# Patient Record
Sex: Male | Born: 1960 | Race: Black or African American | Hispanic: No | Marital: Married | Smoking: Former smoker
Health system: Southern US, Community
[De-identification: ages and names within clinical notes are randomized; demographics above are authoritative.]

## PROBLEM LIST (undated history)

## (undated) DIAGNOSIS — I10 Essential (primary) hypertension: Secondary | ICD-10-CM

## (undated) DIAGNOSIS — W3400XA Accidental discharge from unspecified firearms or gun, initial encounter: Secondary | ICD-10-CM

## (undated) DIAGNOSIS — K219 Gastro-esophageal reflux disease without esophagitis: Secondary | ICD-10-CM

## (undated) DIAGNOSIS — Y249XXA Unspecified firearm discharge, undetermined intent, initial encounter: Secondary | ICD-10-CM

## (undated) HISTORY — PX: HERNIA REPAIR: SHX51

## (undated) HISTORY — PX: APPENDECTOMY: SHX54

## (undated) HISTORY — PX: COLON SURGERY: SHX602

## (undated) HISTORY — PX: MANDIBLE FRACTURE SURGERY: SHX706

## (undated) HISTORY — PX: TOE AMPUTATION: SHX809

## (undated) NOTE — Progress Notes (Signed)
 Formatting of this note is different from the original. Images from the original note were not included.  [address] [place] [address] [phone]  PCP: SOLMON CHERISSE NED, PA-C Elliot Yerian 62 y.o.([birth year]) [facility] MRN: [id number]  Office Visit: 12/10/23 Subjective   Anthony Flowers is a 22 y.o. male who presents for establishment of cardiac care  Chief Complaint:  Chief Complaint  Patient presents with   Consult    Patient was referred by pcp, having CHF, SOB, some chest pain on and off, irregular heart beat,he was seen at [facility] in [facility] March 28.   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   Case discussed with the both patient and patient's family with the patient implied consent Since he refused emergent services I did tell him to proceed with the ER care at this time, patient told me we will go to the ER himself and does not want EMS called.  I told him to make sure to follow up otherwise he has a risk of having bradyarrhythmias worsened, also has risk of pauses and other further major adverse cardiovascular events and he voiced understanding of my recommendation  For his shortness of breath and chest pain after his ER visit and if everything is ruled  out he will benefit from cardiac risk stratification at some point in the future  We will get a Lexiscan MPS scan and echocardiogram in the future after bradyarrhythmias worked up at higher level of care Discussed this plan in detail with the patient and patient's family with the patient implied consent inpatient presence, questions encouraged and answered voiced understanding Review of Systems Review of system done and is negative except in HPI       No results found.              STEADI Stay Independent Questionnaire:  In the past year, patient experienced:    . Current Outpatient Medications on File Prior to Visit  Medication Sig   amLODIPine  (NORVASC ) 10 MG tablet Take 1 tablet (10 mg total) by mouth daily   atorvastatin  (LIPITOR) 40 MG tablet Take 1 tablet (40 mg total) by mouth daily  Indications: High Amount of Fats in the Blood   dapagliflozin (Farxiga) 10 MG Take 1 tablet (10 mg total) by mouth Every morning   furosemide (LASIX) 20 MG tablet Take 1 tablet (20 mg total) by mouth daily as needed   insulin  aspart (NovoLOG ) 100 UNIT/ML injection Inject under the skin 3 times a day (before meals)   omeprazole (PriLOSEC) 40 MG capsule Take 1 capsule (40 mg total) by mouth daily   sacubitril-valsartan (Entresto) 49-51 MG Take 1 tablet by mouth 2 (two) times a day  Indications: Cardiac Failure   sildenafil  (VIAGRA ) 100 MG tablet Take 1 tablet (100 mg total)  by mouth daily as needed for erectile dysfunction   [DISCONTINUED] aspirin  81 MG chewable tablet Chew 1 tablet (81 mg total) daily   [DISCONTINUED] carvedilol  (COREG ) 6.25 MG tablet Take 1 tablet (6.25 mg total) by mouth 2 (two) times a day with meals   [DISCONTINUED] cetirizine (ZyrTEC) 10 MG tablet Take 1 tablet (10 mg total) by mouth daily   [DISCONTINUED] doxazosin (CARDURA) 1 MG tablet Take 1 tablet (1 mg total) by mouth nightly   [DISCONTINUED] ferrous sulfate 325 (65 FE) MG EC tablet Take 1 tablet (325 mg total) by mouth 3 (three)  times a day with meals   [DISCONTINUED] Insulin  Aspart Prot & Aspart (NovoLOG  Mix 70/30 FlexPen) (70-30) 100 UNIT/ML SUPN Inject under the skin   [DISCONTINUED] insulin  glargine (LANTUS ) 100 UNIT/ML injection Inject under the skin nightly   No current facility-administered medications on file prior to visit.   Current Outpatient Medications  Medication Sig Dispense Refill   amLODIPine  (NORVASC ) 10 MG tablet Take 1 tablet (10 mg total) by mouth daily     atorvastatin  (LIPITOR) 40 MG tablet Take 1 tablet (40 mg total) by mouth daily  Indications: High Amount of Fats in the Blood     dapagliflozin (Farxiga) 10 MG Take 1 tablet (10 mg total) by mouth Every morning     furosemide (LASIX) 20 MG tablet Take 1 tablet (20 mg total) by mouth daily as needed     insulin  aspart (NovoLOG ) 100 UNIT/ML injection Inject under the skin 3 times a day (before meals)     omeprazole (PriLOSEC) 40 MG capsule Take 1 capsule (40 mg total) by mouth daily     sacubitril-valsartan (Entresto) 49-51 MG Take 1 tablet by mouth 2 (two) times a day  Indications: Cardiac Failure     sildenafil  (VIAGRA ) 100 MG tablet Take 1 tablet (100 mg total) by mouth daily as needed for erectile dysfunction     No current facility-administered medications for this visit.  Last reviewed on 12/10/2023 10:17 AM by Yesenia Mercado, MA   Medications Discontinued During This Encounter  Medication Reason   insulin  glargine (LANTUS ) 100 UNIT/ML injection Discontinued by another clinician   doxazosin (CARDURA) 1 MG tablet Discontinued by another clinician   ferrous sulfate 325 (65 FE) MG EC tablet Discontinued by another clinician   carvedilol  (COREG ) 6.25 MG tablet Discontinued by another clinician   cetirizine (ZyrTEC) 10 MG tablet Discontinued by another clinician   aspirin  81 MG chewable tablet Discontinued by another clinician   Insulin  Aspart Prot & Aspart (NovoLOG  Mix 70/30 FlexPen) (70-30) 100 UNIT/ML SUPN Discontinued by another  clinician   Medications reviewed by MERCADO, YESENIA at 12/10/2023 10:17 AM  Allergies  Allergen Reactions   Chlorhexidine  Itching and Rash    Develops skin irritation   Tape/Adhesives Hives and Rash    Per pt: can only have paper tape applied to skin   Latex    Penicillins    Fish Allergy Hives and Rash    Patient unsure which fish   Shellfish-Derived Products Hives and Rash    Patient unsure which fish   Allergies reviewed by AURELIA MAIERS  at 12/10/2023 10:14 AM  Past Medical History:  Diagnosis Date   Diabetes mellitus (CMS/HCC)    Heart failure, unspecified 12/07/2023   Hyperlipidemia 12/07/2023   Hypertension    Primary hypertension 12/07/2023   Past Surgical History:  Procedure Laterality Date   AMPUTATION     APPENDECTOMY     FOOT AMPUTATION Left  HERNIA REPAIR     LUNG SURGERY     Social History   Socioeconomic History   Marital status: Married    Spouse name: Not on file   Number of children: Not on file   Years of education: Not on file   Highest education level: Not on file  Tobacco Use   Smoking status: Former   Smokeless tobacco: Never  Vaping Use   Vaping status: never used  Substance and Sexual Activity   Alcohol use: Not Currently   Drug use: Not Currently   Sexual activity: Not on file   No family history on file.   Objective  Vitals:   12/10/23 1002  BP: (!) 167/88  BP Location: Left arm  Patient Position: Sitting  Pulse: 75  SpO2: 99%  Weight: 112 kg (248 lb)  Height: 5' 10 (1.778 m)   Physical Exam   Const: Mild distress  Head:  Atraumatic Mental: Oriented to questions  ENT:  Normal appearing external Ears, Nose and Mouth  Neck:  Neck supple  Resp:  No obvious tachypnea  Cards: Normal rate with abnormal EKG as above  Abd:  No obvious distention.  Skin:  No obvious skin lesion on face  Back:  No severe kyphosis or scoliosis  Ext:  No significant deformity was noted Psych.            No acute anxiety noted Neuro.             Alert to questions  Results / Recent Labs:     Assessment/Plan   1. 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   Case discussed with the both patient and patient's family with the patient implied consent Since he refused emergent services I did tell him to proceed with the ER care at this time, patient told me we will go to the ER himself and does not want EMS called.  I told him to make sure to follow up otherwise he has a risk of having bradyarrhythmias worsened, also has risk of pauses and other further major adverse cardiovascular events and he voiced understanding of my recommendation  For his shortness of breath and chest pain after his ER visit and if everything is ruled out he will benefit from cardiac risk stratification at some point in the future  We will get a Lexiscan MPS scan and echocardiogram in the future after bradyarrhythmias worked up at higher level of care Discussed this plan in detail with the patient and patient's family with the patient implied consent inpatient presence, questions encouraged and answered voiced understanding  2. Severe stage 4- 5 CKD Patient's creatinine around 3.5 and higher Patient's GFR is below 20 with severe stage 4-5 kidney disease  Patient is kept on Entresto by the primary care at this time and also followed up by Nephrology  Further  care per Nephrology at this time [place] and  his medications and any other further recommendations  3. Hypertension  On medical management  Further titration and follow up after ruled out for any acute bradyarrhythmias at higher level of care today which I recommended to the patient  4. Diabetes mellitus  Per primary team   5.  Dyslipidemia Patient on statin therapy   In the meantime, continue with PCP for further aggressive risk factor modification measures, including:  Exercise, weight loss and reduced sodium intake to help with blood pressure control, with a goal of <130/80.  Avoiding exposure to tobacco products in any form  and compliance with medications. Achieving and/or maintaining ideal body weight and 2 gram sodium diet. Maintaining  Hgb/A1c less than 6.5,  total cholesterol less than 200 mg/dL, HDL greater than 50 mg/dL, LDL less than 70 mg/dL, triglycerides less than 150 mg/dL.  Lipid and liver profile checked per pt's PCP's preferred schedule and  reduce intake of cholesterol and saturated fats such as fried foods,  red meat, dairy products, and high caloric foods.   I have provided services to the patient in the office setting.  During the course of the visit, we reviewed the patient's chief complaint, medical history, current symptoms, medications, diagnosis, test results, treatment plan.  A physical exam was performed.  All questions answered.  Chart review and documentation completed, future tests and/or procedures ordered. The visit required a hgh  degree of complexity and decision making due to multiple cardiac disease processes and comorbidities.    Electronically Signed by: Marcelino Lieu, MD Electronically signed by Marcelino Lieu, MD at 12/10/2023  9:18 AM PDT

---

## 1998-12-15 ENCOUNTER — Emergency Department (HOSPITAL_COMMUNITY): Admission: EM | Admit: 1998-12-15 | Discharge: 1998-12-15 | Payer: Self-pay | Admitting: Emergency Medicine

## 2000-09-26 ENCOUNTER — Encounter: Payer: Self-pay | Admitting: *Deleted

## 2000-09-26 ENCOUNTER — Inpatient Hospital Stay (HOSPITAL_COMMUNITY): Admission: EM | Admit: 2000-09-26 | Discharge: 2000-09-28 | Payer: Self-pay | Admitting: *Deleted

## 2000-09-26 ENCOUNTER — Encounter: Payer: Self-pay | Admitting: Internal Medicine

## 2000-12-01 ENCOUNTER — Emergency Department (HOSPITAL_COMMUNITY): Admission: EM | Admit: 2000-12-01 | Discharge: 2000-12-02 | Payer: Self-pay | Admitting: Emergency Medicine

## 2004-06-30 ENCOUNTER — Ambulatory Visit: Payer: Self-pay | Admitting: *Deleted

## 2004-07-03 ENCOUNTER — Emergency Department (HOSPITAL_COMMUNITY): Admission: EM | Admit: 2004-07-03 | Discharge: 2004-07-03 | Payer: Self-pay | Admitting: Emergency Medicine

## 2004-07-12 ENCOUNTER — Ambulatory Visit: Payer: Self-pay | Admitting: Family Medicine

## 2004-07-18 ENCOUNTER — Ambulatory Visit: Payer: Self-pay | Admitting: Internal Medicine

## 2004-08-08 ENCOUNTER — Ambulatory Visit: Payer: Self-pay | Admitting: Internal Medicine

## 2004-09-06 ENCOUNTER — Ambulatory Visit: Payer: Self-pay | Admitting: Internal Medicine

## 2004-11-08 ENCOUNTER — Ambulatory Visit: Payer: Self-pay | Admitting: Internal Medicine

## 2009-09-06 ENCOUNTER — Encounter: Admission: RE | Admit: 2009-09-06 | Discharge: 2009-09-06 | Payer: Self-pay | Admitting: Podiatrist

## 2009-11-23 ENCOUNTER — Encounter: Admission: RE | Admit: 2009-11-23 | Discharge: 2009-11-23 | Payer: Self-pay | Admitting: Surgery

## 2010-03-06 ENCOUNTER — Inpatient Hospital Stay (HOSPITAL_COMMUNITY)
Admission: EM | Admit: 2010-03-06 | Discharge: 2010-03-09 | Payer: Self-pay | Source: Home / Self Care | Admitting: Emergency Medicine

## 2010-03-07 ENCOUNTER — Ambulatory Visit: Payer: Self-pay | Admitting: Vascular Surgery

## 2010-03-07 ENCOUNTER — Encounter (INDEPENDENT_AMBULATORY_CARE_PROVIDER_SITE_OTHER): Payer: Self-pay | Admitting: Internal Medicine

## 2010-08-30 ENCOUNTER — Emergency Department (HOSPITAL_COMMUNITY)
Admission: EM | Admit: 2010-08-30 | Discharge: 2010-08-30 | Payer: Self-pay | Source: Home / Self Care | Admitting: Emergency Medicine

## 2010-09-05 LAB — POCT I-STAT 3, ART BLOOD GAS (G3+)
Acid-Base Excess: 2 mmol/L (ref 0.0–2.0)
Bicarbonate: 25.6 meq/L — ABNORMAL HIGH (ref 20.0–24.0)
O2 Saturation: 100 %
Patient temperature: 98.7
TCO2: 27 mmol/L (ref 0–100)
pCO2 arterial: 35.4 mmHg (ref 35.0–45.0)
pH, Arterial: 7.468 — ABNORMAL HIGH (ref 7.350–7.450)
pO2, Arterial: 268 mmHg — ABNORMAL HIGH (ref 80.0–100.0)

## 2010-09-05 LAB — CBC
HCT: 40.9 % (ref 39.0–52.0)
Hemoglobin: 12.9 g/dL — ABNORMAL LOW (ref 13.0–17.0)
MCH: 22.4 pg — ABNORMAL LOW (ref 26.0–34.0)
MCHC: 31.5 g/dL (ref 30.0–36.0)
MCV: 71.1 fL — ABNORMAL LOW (ref 78.0–100.0)
Platelets: 376 K/uL (ref 150–400)
RBC: 5.75 MIL/uL (ref 4.22–5.81)
RDW: 17 % — ABNORMAL HIGH (ref 11.5–15.5)
WBC: 6.3 K/uL (ref 4.0–10.5)

## 2010-09-05 LAB — BASIC METABOLIC PANEL WITH GFR
BUN: 8 mg/dL (ref 6–23)
CO2: 27 meq/L (ref 19–32)
Calcium: 9.6 mg/dL (ref 8.4–10.5)
Chloride: 106 meq/L (ref 96–112)
Creatinine, Ser: 1.48 mg/dL (ref 0.4–1.5)
GFR calc Af Amer: >60 mL/min (ref >60)
GFR calc non Af Amer: 50 mL/min — ABNORMAL LOW (ref >60)
Glucose, Bld: 134 mg/dL — ABNORMAL HIGH (ref 70–99)
Potassium: 3.9 meq/L (ref 3.5–5.1)
Sodium: 140 meq/L (ref 135–145)

## 2010-09-05 LAB — CARBOXYHEMOGLOBIN
Carboxyhemoglobin: 3.1 % — ABNORMAL HIGH (ref 0.5–1.5)
Methemoglobin: 0.5 % (ref 0.0–1.5)
O2 Saturation: 100 %
Total hemoglobin: 12.1 g/dL — ABNORMAL LOW (ref 13.5–18.0)

## 2010-09-05 LAB — DIFFERENTIAL
Basophils Absolute: 0 K/uL (ref 0.0–0.1)
Basophils Relative: 0 % (ref 0–1)
Eosinophils Absolute: 0.2 K/uL (ref 0.0–0.7)
Eosinophils Relative: 3 % (ref 0–5)
Lymphocytes Relative: 34 % (ref 12–46)
Lymphs Abs: 2.1 K/uL (ref 0.7–4.0)
Monocytes Absolute: 0.4 K/uL (ref 0.1–1.0)
Monocytes Relative: 7 % (ref 3–12)
Neutro Abs: 3.6 K/uL (ref 1.7–7.7)
Neutrophils Relative %: 56 % (ref 43–77)

## 2010-09-19 ENCOUNTER — Emergency Department (HOSPITAL_COMMUNITY)
Admission: EM | Admit: 2010-09-19 | Discharge: 2010-09-19 | Payer: Self-pay | Source: Home / Self Care | Admitting: Family Medicine

## 2010-11-05 LAB — GLUCOSE, CAPILLARY
Glucose-Capillary: 106 mg/dL — ABNORMAL HIGH (ref 70–99)
Glucose-Capillary: 118 mg/dL — ABNORMAL HIGH (ref 70–99)
Glucose-Capillary: 168 mg/dL — ABNORMAL HIGH (ref 70–99)
Glucose-Capillary: 178 mg/dL — ABNORMAL HIGH (ref 70–99)
Glucose-Capillary: 221 mg/dL — ABNORMAL HIGH (ref 70–99)
Glucose-Capillary: 245 mg/dL — ABNORMAL HIGH (ref 70–99)
Glucose-Capillary: 268 mg/dL — ABNORMAL HIGH (ref 70–99)
Glucose-Capillary: 285 mg/dL — ABNORMAL HIGH (ref 70–99)
Glucose-Capillary: 307 mg/dL — ABNORMAL HIGH (ref 70–99)
Glucose-Capillary: 323 mg/dL — ABNORMAL HIGH (ref 70–99)
Glucose-Capillary: 348 mg/dL — ABNORMAL HIGH (ref 70–99)
Glucose-Capillary: 351 mg/dL — ABNORMAL HIGH (ref 70–99)
Glucose-Capillary: 385 mg/dL — ABNORMAL HIGH (ref 70–99)
Glucose-Capillary: 428 mg/dL — ABNORMAL HIGH (ref 70–99)

## 2010-11-05 LAB — CBC
HCT: 38.2 % — ABNORMAL LOW (ref 39.0–52.0)
HCT: 39.6 % (ref 39.0–52.0)
HCT: 43.2 % (ref 39.0–52.0)
Hemoglobin: 11.9 g/dL — ABNORMAL LOW (ref 13.0–17.0)
Hemoglobin: 12.7 g/dL — ABNORMAL LOW (ref 13.0–17.0)
Hemoglobin: 13.7 g/dL (ref 13.0–17.0)
MCH: 23.2 pg — ABNORMAL LOW (ref 26.0–34.0)
MCH: 23.5 pg — ABNORMAL LOW (ref 26.0–34.0)
MCH: 23.9 pg — ABNORMAL LOW (ref 26.0–34.0)
MCHC: 31.2 g/dL (ref 30.0–36.0)
MCHC: 31.8 g/dL (ref 30.0–36.0)
MCHC: 32 g/dL (ref 30.0–36.0)
MCV: 74 fL — ABNORMAL LOW (ref 78.0–100.0)
MCV: 74.2 fL — ABNORMAL LOW (ref 78.0–100.0)
MCV: 74.8 fL — ABNORMAL LOW (ref 78.0–100.0)
Platelets: 246 K/uL (ref 150–400)
Platelets: 258 K/uL (ref 150–400)
Platelets: 283 K/uL (ref 150–400)
RBC: 5.14 MIL/uL (ref 4.22–5.81)
RBC: 5.3 MIL/uL (ref 4.22–5.81)
RBC: 5.84 MIL/uL — ABNORMAL HIGH (ref 4.22–5.81)
RDW: 15.8 % — ABNORMAL HIGH (ref 11.5–15.5)
RDW: 15.9 % — ABNORMAL HIGH (ref 11.5–15.5)
RDW: 16 % — ABNORMAL HIGH (ref 11.5–15.5)
WBC: 11.3 K/uL — ABNORMAL HIGH (ref 4.0–10.5)
WBC: 8.3 K/uL (ref 4.0–10.5)
WBC: 9.2 K/uL (ref 4.0–10.5)

## 2010-11-05 LAB — D-DIMER, QUANTITATIVE: D-Dimer, Quant: 1.2 ug{FEU}/mL — ABNORMAL HIGH (ref 0.00–0.48)

## 2010-11-05 LAB — BASIC METABOLIC PANEL WITH GFR
BUN: 7 mg/dL (ref 6–23)
CO2: 28 meq/L (ref 19–32)
Calcium: 9 mg/dL (ref 8.4–10.5)
Chloride: 96 meq/L (ref 96–112)
Creatinine, Ser: 1.33 mg/dL (ref 0.4–1.5)
GFR calc Af Amer: >60 mL/min (ref >60)
GFR calc non Af Amer: 57 mL/min — ABNORMAL LOW (ref >60)
Glucose, Bld: 349 mg/dL — ABNORMAL HIGH (ref 70–99)
Potassium: 3.7 meq/L (ref 3.5–5.1)
Sodium: 135 meq/L (ref 135–145)

## 2010-11-05 LAB — CARDIAC PANEL(CRET KIN+CKTOT+MB+TROPI)
CK, MB: 1.2 ng/mL (ref 0.3–4.0)
CK, MB: 1.3 ng/mL (ref 0.3–4.0)
CK, MB: 1.6 ng/mL (ref 0.3–4.0)
Relative Index: 1.5 (ref 0.0–2.5)
Relative Index: INVALID (ref 0.0–2.5)
Relative Index: INVALID (ref 0.0–2.5)
Total CK: 108 U/L (ref 7–232)
Total CK: 92 U/L (ref 7–232)
Total CK: 98 U/L (ref 7–232)
Troponin I: 0.02 ng/mL (ref 0.00–0.06)
Troponin I: 0.02 ng/mL (ref 0.00–0.06)
Troponin I: 0.03 ng/mL (ref 0.00–0.06)

## 2010-11-05 LAB — URINALYSIS, ROUTINE W REFLEX MICROSCOPIC
Glucose, UA: NEGATIVE mg/dL
Ketones, ur: 15 mg/dL — AB
Leukocytes, UA: NEGATIVE
Nitrite: NEGATIVE
Protein, ur: >300 mg/dL — AB
Specific Gravity, Urine: 1.022 (ref 1.005–1.030)
Urobilinogen, UA: >8 mg/dL — ABNORMAL HIGH (ref 0.0–1.0)
pH: 5.5 (ref 5.0–8.0)

## 2010-11-05 LAB — COMPREHENSIVE METABOLIC PANEL WITH GFR
ALT: 17 U/L (ref 0–53)
ALT: 18 U/L (ref 0–53)
AST: 29 U/L (ref 0–37)
AST: 36 U/L (ref 0–37)
Albumin: 2.5 g/dL — ABNORMAL LOW (ref 3.5–5.2)
Albumin: 2.9 g/dL — ABNORMAL LOW (ref 3.5–5.2)
Alkaline Phosphatase: 61 U/L (ref 39–117)
Alkaline Phosphatase: 67 U/L (ref 39–117)
BUN: 10 mg/dL (ref 6–23)
BUN: 14 mg/dL (ref 6–23)
CO2: 23 meq/L (ref 19–32)
CO2: 23 meq/L (ref 19–32)
Calcium: 7.9 mg/dL — ABNORMAL LOW (ref 8.4–10.5)
Calcium: 8.6 mg/dL (ref 8.4–10.5)
Chloride: 101 meq/L (ref 96–112)
Chloride: 98 meq/L (ref 96–112)
Creatinine, Ser: 1.58 mg/dL — ABNORMAL HIGH (ref 0.4–1.5)
Creatinine, Ser: 1.65 mg/dL — ABNORMAL HIGH (ref 0.4–1.5)
GFR calc Af Amer: 54 mL/min — ABNORMAL LOW (ref >60)
GFR calc Af Amer: 57 mL/min — ABNORMAL LOW (ref >60)
GFR calc non Af Amer: 45 mL/min — ABNORMAL LOW (ref >60)
GFR calc non Af Amer: 47 mL/min — ABNORMAL LOW (ref >60)
Glucose, Bld: 128 mg/dL — ABNORMAL HIGH (ref 70–99)
Glucose, Bld: 330 mg/dL — ABNORMAL HIGH (ref 70–99)
Potassium: 3.5 meq/L (ref 3.5–5.1)
Potassium: 4.1 meq/L (ref 3.5–5.1)
Sodium: 130 meq/L — ABNORMAL LOW (ref 135–145)
Sodium: 133 meq/L — ABNORMAL LOW (ref 135–145)
Total Bilirubin: 1.1 mg/dL (ref 0.3–1.2)
Total Bilirubin: 1.3 mg/dL — ABNORMAL HIGH (ref 0.3–1.2)
Total Protein: 5.9 g/dL — ABNORMAL LOW (ref 6.0–8.3)
Total Protein: 6.6 g/dL (ref 6.0–8.3)

## 2010-11-05 LAB — CULTURE, BLOOD (ROUTINE X 2)
Culture: NO GROWTH
Culture: NO GROWTH

## 2010-11-05 LAB — DIFFERENTIAL
Basophils Absolute: 0 K/uL (ref 0.0–0.1)
Basophils Relative: 0 % (ref 0–1)
Eosinophils Absolute: 0 K/uL (ref 0.0–0.7)
Eosinophils Relative: 0 % (ref 0–5)
Lymphocytes Relative: 12 % (ref 12–46)
Lymphs Abs: 1.3 K/uL (ref 0.7–4.0)
Monocytes Absolute: 1 K/uL (ref 0.1–1.0)
Monocytes Relative: 9 % (ref 3–12)
Neutro Abs: 8.9 K/uL — ABNORMAL HIGH (ref 1.7–7.7)
Neutrophils Relative %: 79 % — ABNORMAL HIGH (ref 43–77)

## 2010-11-05 LAB — URINE CULTURE
Colony Count: NO GROWTH
Culture: NO GROWTH

## 2010-11-05 LAB — PROTEIN / CREATININE RATIO, URINE
Creatinine, Urine: 199.3 mg/dL
Protein Creatinine Ratio: 0.64 — ABNORMAL HIGH (ref 0.00–0.15)
Total Protein, Urine: 128 mg/dL

## 2010-11-05 LAB — URINE MICROSCOPIC-ADD ON

## 2010-11-05 LAB — DRUGS OF ABUSE SCREEN W/O ALC, ROUTINE URINE
Amphetamine Screen, Ur: NEGATIVE
Barbiturate Quant, Ur: NEGATIVE
Benzodiazepines.: NEGATIVE
Cocaine Metabolites: POSITIVE — AB
Creatinine,U: 202.7 mg/dL
Marijuana Metabolite: NEGATIVE
Methadone: NEGATIVE
Opiate Screen, Urine: NEGATIVE
Phencyclidine (PCP): NEGATIVE
Propoxyphene: NEGATIVE

## 2010-11-05 LAB — COCAINE, URINE, CONFIRMATION: Benzoylecgonine GC/MS Conf: 1153 ng/mL — ABNORMAL HIGH

## 2010-11-05 LAB — HEMOGLOBIN A1C
Hgb A1c MFr Bld: 7.2 % — ABNORMAL HIGH (ref <5.7)
Mean Plasma Glucose: 160 mg/dL — ABNORMAL HIGH (ref <117)

## 2010-11-05 LAB — MRSA PCR SCREENING: MRSA by PCR: NEGATIVE

## 2010-11-05 LAB — TSH: TSH: 1.514 u[IU]/mL (ref 0.350–4.500)

## 2010-11-05 LAB — GLUCOSE, RANDOM: Glucose, Bld: 345 mg/dL — ABNORMAL HIGH (ref 70–99)

## 2010-11-10 ENCOUNTER — Inpatient Hospital Stay (INDEPENDENT_AMBULATORY_CARE_PROVIDER_SITE_OTHER)
Admission: RE | Admit: 2010-11-10 | Discharge: 2010-11-10 | Disposition: A | Discharge status: Home / Self Care | Payer: Medicare Other | Source: Ambulatory Visit | Attending: Family Medicine | Admitting: Family Medicine

## 2010-11-10 DIAGNOSIS — I10 Essential (primary) hypertension: Secondary | ICD-10-CM

## 2010-11-10 DIAGNOSIS — E119 Type 2 diabetes mellitus without complications: Secondary | ICD-10-CM

## 2010-11-10 LAB — GLUCOSE, CAPILLARY: Glucose-Capillary: 369 mg/dL — ABNORMAL HIGH (ref 70–99)

## 2011-01-06 NOTE — Discharge Summary (Signed)
[facility]  Patient:    Anthony Flowers, Anthony Flowers                      MRN: [id number] Adm. Date:  [id number] Disc. Date: [id number] Attending:  Thomes Lolling Dictator:   Jannette Fogo, M.D. CC:         [facility]   Discharge Summary  DISCHARGE DIAGNOSES: 1. Diabetic ketoacidosis, resolved. 2. Diabetes mellitus. 3. Severe periodontal disease. 4. Cocaine abuse. 5. Tobacco abuse. 6. Gastroesophageal reflux disease. 7. Microcytic anemia with heme-negative stools.  DISCHARGE MEDICATIONS: 1. Insulin 70/30, 35 units q.a.m. and 30 units q.p.m. 2. Protonix 1 p.o. q.h.s. 3. Maalox 1 tablespoon p.r.n.  FOLLOWUP:  The patient has an appointment at the [facility] on February 14th at 2 p.m.  His severe periodontal disease seems to play a role in his developing DKA and needs to be followed up.  He also has a follow-up appointment at [facility] on November 09, 2000.  PROCEDURES:  None.  CONSULTATIONS:  None.  HISTORY OF PRESENT ILLNESS:   The patient is a 50 year old African-American male diagnosed with diabetes in 1999 who presented with a one-day history of nausea and vomiting.  He is homeless and states that he ran out of food approximately 2-3 days prior to admission and thus stopped taking his insulin. He began having nausea and diffuse abdominal pain the day prior to admission and had approximately 12 episodes of emesis.  His last episode of emesis was on the morning of admission.  He also complained of substernal-epigastric pain described as burning and radiating to his left arm, but with no accompanying shortness of breath.  The pain was constant.  PHYSICAL EXAMINATION:  Temperature 96.5, blood pressure 148/88, pulse 108, respirations 20.  General:  Thin, alert and oriented African-American male. awake and oriented but drowsy.  HEENT:  PERRL, EOMI.  Poor dentition, with several visibly loose teeth.  Neck:  No LAD, no JVD.   Cardiovascular:  Regular rate and rhythm, no murmurs, rubs or gallops.  Respiratory:  Clear with good air movement.  Abdomen:  Soft, nontender with positive bowel sounds, no hepatosplenomegaly.  Extremities:  No clubbing, cyanosis, or edema.  Rectal: Heme-negative.  Neurologic:  Cranial nerves II-XII intact, no focal deficits.  ADMISSION LABORATORY DATA:  Sodium 131, potassium 5.0, chloride 86, bicarb 20, BUN 26, creatinine 2.0, platelets 526, total protein 8.9, albumin 4.3, total bilirubin 2.0, alk phos 99, AST 15, ALT 12, white blood cells 14.4, hemoglobin 13, platelets 590, MCV 71.9, PT 13.3, INR 1.1.  Serum acetone positive, amylase 34, lipase 17, urine drug screen positive for cocaine and PCAs.  CK-MB 119, troponin 0.01.  Urinalysis:  Specific gravity 1.023, glucose greater than 1000, ketones greater than 80.  EKG shows sinus tachycardia with first degree AV block.  Chest x-ray showed no active disease.  HOSPITAL COURSE: #1 - Diabetic ketoacidosis.  The patient was admitted and started on an insulin drip.  His acidosis was mild on admission and quickly corrected.  The insulin drip was able to be discontinued several hours after admission.  He was restarted on his regular dose of 70/30 insulin and was stable at discharge.  #2 - Periodontal disease.  The patient has not had dental care in several years.  He states that due to his poor dentition, he frequently stops eating, thus quits taking his insulin and develops DKA.  It is therefore, quite urgent that he receive  some dental care.  The [facility] was kind enough to set up an appointment for the patient on an emergent basis for February 14th at 2 p.m.  #3 - Microcytic anemia.  The patient was not anemic on admission, however, after rehydration, his hemoglobin dropped to 10.9, his MCV is extremely low at 73.9, possibly consistent with thalassemia.  His stools were heme-negative. His anemia can be further worked up  as an outpatient by his new physicians at [facility].  DISCHARGE LABORATORY DATA:  White blood cells 6.6, hemoglobin 9.7, MCV 74.1, platelets 370, PT 13.3, INR 1.1, sodium 131, potassium 3.1, chloride 101, bicarb 25, glucose 196, BUN 5, creatinine 0.6, calcium 8.4, hemoglobin A1c 11.3, last set of cardiac enzymes, CK 98, MB 2.2, troponin 0.01, cholesterol 122, triglycerides 57, HDL 40, LDL 71.  DD:  10/19/00 TD:  10/21/00 Job: [id number]

## 2011-09-01 ENCOUNTER — Encounter (HOSPITAL_BASED_OUTPATIENT_CLINIC_OR_DEPARTMENT_OTHER): Payer: Medicare Other

## 2011-09-29 ENCOUNTER — Encounter (HOSPITAL_BASED_OUTPATIENT_CLINIC_OR_DEPARTMENT_OTHER): Payer: Medicare Other

## 2011-09-29 ENCOUNTER — Encounter (HOSPITAL_BASED_OUTPATIENT_CLINIC_OR_DEPARTMENT_OTHER): Payer: Medicare Other | Attending: General Surgery

## 2011-09-29 DIAGNOSIS — L97509 Non-pressure chronic ulcer of other part of unspecified foot with unspecified severity: Secondary | ICD-10-CM | POA: Insufficient documentation

## 2011-09-29 DIAGNOSIS — S98139A Complete traumatic amputation of one unspecified lesser toe, initial encounter: Secondary | ICD-10-CM | POA: Insufficient documentation

## 2011-09-29 DIAGNOSIS — E1169 Type 2 diabetes mellitus with other specified complication: Secondary | ICD-10-CM | POA: Insufficient documentation

## 2011-09-29 DIAGNOSIS — I10 Essential (primary) hypertension: Secondary | ICD-10-CM | POA: Insufficient documentation

## 2011-09-29 DIAGNOSIS — I890 Lymphedema, not elsewhere classified: Secondary | ICD-10-CM | POA: Insufficient documentation
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
## 2011-10-05 ENCOUNTER — Other Ambulatory Visit (HOSPITAL_BASED_OUTPATIENT_CLINIC_OR_DEPARTMENT_OTHER): Payer: Self-pay | Admitting: General Surgery

## 2011-10-05 ENCOUNTER — Ambulatory Visit (HOSPITAL_COMMUNITY)
Admission: RE | Admit: 2011-10-05 | Discharge: 2011-10-05 | Disposition: A | Discharge status: Home / Self Care | Payer: Medicare Other | Source: Ambulatory Visit | Attending: General Surgery | Admitting: General Surgery

## 2011-10-05 DIAGNOSIS — I10 Essential (primary) hypertension: Secondary | ICD-10-CM | POA: Insufficient documentation

## 2011-10-05 DIAGNOSIS — R52 Pain, unspecified: Secondary | ICD-10-CM

## 2011-10-05 DIAGNOSIS — M899 Disorder of bone, unspecified: Secondary | ICD-10-CM | POA: Insufficient documentation

## 2011-10-05 DIAGNOSIS — M7730 Calcaneal spur, unspecified foot: Secondary | ICD-10-CM | POA: Insufficient documentation

## 2011-10-05 DIAGNOSIS — M2010 Hallux valgus (acquired), unspecified foot: Secondary | ICD-10-CM | POA: Insufficient documentation

## 2011-10-05 DIAGNOSIS — R059 Cough, unspecified: Secondary | ICD-10-CM | POA: Insufficient documentation

## 2011-10-05 DIAGNOSIS — R05 Cough: Secondary | ICD-10-CM | POA: Insufficient documentation

## 2011-10-05 DIAGNOSIS — E119 Type 2 diabetes mellitus without complications: Secondary | ICD-10-CM | POA: Insufficient documentation

## 2011-10-06 ENCOUNTER — Encounter (INDEPENDENT_AMBULATORY_CARE_PROVIDER_SITE_OTHER): Payer: Medicare Other | Admitting: *Deleted

## 2011-10-06 DIAGNOSIS — L97409 Non-pressure chronic ulcer of unspecified heel and midfoot with unspecified severity: Secondary | ICD-10-CM

## 2011-10-11 LAB — GLUCOSE, CAPILLARY
Glucose-Capillary: 101 mg/dL — ABNORMAL HIGH (ref 70–99)
Glucose-Capillary: 132 mg/dL — ABNORMAL HIGH (ref 70–99)
Glucose-Capillary: 85 mg/dL (ref 70–99)
Glucose-Capillary: 201 mg/dL — ABNORMAL HIGH (ref 70–99)

## 2011-10-27 ENCOUNTER — Encounter (HOSPITAL_BASED_OUTPATIENT_CLINIC_OR_DEPARTMENT_OTHER): Payer: Medicare Other | Attending: General Surgery

## 2011-10-27 DIAGNOSIS — I10 Essential (primary) hypertension: Secondary | ICD-10-CM | POA: Insufficient documentation

## 2011-10-27 DIAGNOSIS — E1169 Type 2 diabetes mellitus with other specified complication: Secondary | ICD-10-CM | POA: Insufficient documentation

## 2011-10-27 DIAGNOSIS — I890 Lymphedema, not elsewhere classified: Secondary | ICD-10-CM | POA: Insufficient documentation

## 2011-10-27 DIAGNOSIS — L97509 Non-pressure chronic ulcer of other part of unspecified foot with unspecified severity: Secondary | ICD-10-CM | POA: Insufficient documentation

## 2011-10-27 DIAGNOSIS — S98139A Complete traumatic amputation of one unspecified lesser toe, initial encounter: Secondary | ICD-10-CM | POA: Insufficient documentation

## 2011-11-06 NOTE — Progress Notes (Signed)
[facility]  NAME:  Anthony Flowers, Anthony Flowers                    ACCOUNT NO.:  MEDICAL RECORD NO.:  [id number]      DATE OF BIRTH:  10/27/1960  PHYSICIAN:  Judene Companion, M.D.           VISIT DATE:                                  OFFICE VISIT   [name] is a 51 year old African American male, who is diabetic.  He is on Lantus and NovoLog insulin.  He also has hypertension and takes lisinopril and simvastatin and amlodipine.  He has a postoperative status where he had the 4th and 5th toes amputated on the left foot and he has left now with an open wound about 2 cm in diameter that goes down close to the bone.  We had x-rays taken, which shows osteomyelitis involving the metatarsal bones.  He is on Bactrim and Cipro and is cultured out MRSA.  Because of this being such a deep wound and his diabetic status, we are applying for hyperbaric oxygen treatments along with the antibiotics.  He also may be a candidate for a Dermagraft, which we will apply after we clean this wound up some.  Today, his vital signs were normal.  His blood pressure is 140/90.  His temperature is 98.6.  He was debrided of some devascularized tissue and some subcu and the depth of this ulcer on his foot, and he does have palpable pulses, which should be helpful in trying to heal this wound.     Judene Companion, M.D.     PP/MEDQ  D:  11/03/2011  T:  11/03/2011  Job:  [id number]

## 2011-11-23 ENCOUNTER — Encounter (HOSPITAL_BASED_OUTPATIENT_CLINIC_OR_DEPARTMENT_OTHER): Payer: Medicare Other | Attending: Internal Medicine

## 2011-11-23 DIAGNOSIS — Z794 Long term (current) use of insulin: Secondary | ICD-10-CM | POA: Insufficient documentation

## 2011-11-23 DIAGNOSIS — L97509 Non-pressure chronic ulcer of other part of unspecified foot with unspecified severity: Secondary | ICD-10-CM | POA: Insufficient documentation

## 2011-11-23 DIAGNOSIS — L84 Corns and callosities: Secondary | ICD-10-CM | POA: Insufficient documentation

## 2011-11-23 DIAGNOSIS — M869 Osteomyelitis, unspecified: Secondary | ICD-10-CM | POA: Insufficient documentation

## 2011-11-23 DIAGNOSIS — E1169 Type 2 diabetes mellitus with other specified complication: Secondary | ICD-10-CM | POA: Insufficient documentation

## 2011-11-23 DIAGNOSIS — S98139A Complete traumatic amputation of one unspecified lesser toe, initial encounter: Secondary | ICD-10-CM | POA: Insufficient documentation

## 2011-12-21 ENCOUNTER — Encounter (HOSPITAL_BASED_OUTPATIENT_CLINIC_OR_DEPARTMENT_OTHER): Payer: Medicare Other

## 2012-01-05 ENCOUNTER — Encounter (HOSPITAL_BASED_OUTPATIENT_CLINIC_OR_DEPARTMENT_OTHER): Payer: Medicare Other | Attending: General Surgery

## 2012-01-05 DIAGNOSIS — I10 Essential (primary) hypertension: Secondary | ICD-10-CM | POA: Insufficient documentation

## 2012-01-05 DIAGNOSIS — L84 Corns and callosities: Secondary | ICD-10-CM | POA: Insufficient documentation

## 2012-01-05 DIAGNOSIS — L97509 Non-pressure chronic ulcer of other part of unspecified foot with unspecified severity: Secondary | ICD-10-CM | POA: Insufficient documentation

## 2012-01-05 DIAGNOSIS — E7800 Pure hypercholesterolemia, unspecified: Secondary | ICD-10-CM | POA: Insufficient documentation

## 2012-01-05 DIAGNOSIS — Z79899 Other long term (current) drug therapy: Secondary | ICD-10-CM | POA: Insufficient documentation

## 2012-01-05 DIAGNOSIS — E1169 Type 2 diabetes mellitus with other specified complication: Secondary | ICD-10-CM | POA: Insufficient documentation

## 2012-01-26 ENCOUNTER — Encounter (HOSPITAL_BASED_OUTPATIENT_CLINIC_OR_DEPARTMENT_OTHER): Payer: Medicare Other | Attending: General Surgery

## 2012-01-26 DIAGNOSIS — L97509 Non-pressure chronic ulcer of other part of unspecified foot with unspecified severity: Secondary | ICD-10-CM | POA: Insufficient documentation

## 2012-01-26 DIAGNOSIS — L84 Corns and callosities: Secondary | ICD-10-CM | POA: Insufficient documentation

## 2012-01-26 DIAGNOSIS — Z794 Long term (current) use of insulin: Secondary | ICD-10-CM | POA: Insufficient documentation

## 2012-01-26 DIAGNOSIS — E7800 Pure hypercholesterolemia, unspecified: Secondary | ICD-10-CM | POA: Insufficient documentation

## 2012-01-26 DIAGNOSIS — S98139A Complete traumatic amputation of one unspecified lesser toe, initial encounter: Secondary | ICD-10-CM | POA: Insufficient documentation

## 2012-01-26 DIAGNOSIS — E1169 Type 2 diabetes mellitus with other specified complication: Secondary | ICD-10-CM | POA: Insufficient documentation

## 2012-01-26 DIAGNOSIS — I10 Essential (primary) hypertension: Secondary | ICD-10-CM | POA: Insufficient documentation

## 2012-01-26 DIAGNOSIS — Z79899 Other long term (current) drug therapy: Secondary | ICD-10-CM | POA: Insufficient documentation

## 2012-02-11 ENCOUNTER — Encounter (HOSPITAL_COMMUNITY): Payer: Self-pay | Admitting: Emergency Medicine

## 2012-02-11 ENCOUNTER — Emergency Department (HOSPITAL_COMMUNITY)
Admission: EM | Admit: 2012-02-11 | Discharge: 2012-02-11 | Disposition: A | Discharge status: Home / Self Care | Payer: Medicare Other | Attending: Emergency Medicine | Admitting: Emergency Medicine

## 2012-02-11 DIAGNOSIS — M79609 Pain in unspecified limb: Secondary | ICD-10-CM | POA: Insufficient documentation

## 2012-02-11 DIAGNOSIS — Z4789 Encounter for other orthopedic aftercare: Secondary | ICD-10-CM

## 2012-02-11 DIAGNOSIS — R209 Unspecified disturbances of skin sensation: Secondary | ICD-10-CM | POA: Insufficient documentation

## 2012-02-11 HISTORY — DX: Unspecified firearm discharge, undetermined intent, initial encounter: Y24.9XXA

## 2012-02-11 HISTORY — DX: Essential (primary) hypertension: I10

## 2012-02-11 HISTORY — DX: Accidental discharge from unspecified firearms or gun, initial encounter: W34.00XA

## 2012-02-11 NOTE — Discharge Instructions (Signed)
Please followup with wound care clinic. Do not bear weight on your affected foot.

## 2012-02-11 NOTE — ED Notes (Signed)
PT st's he has a cast on left lower leg due to a sore that was not healing.  St's the cast is broken and now his toes feel numb.  Pt st's he can not see his MD til Fri.  Wants cast off.
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
## 2012-02-11 NOTE — Progress Notes (Signed)
Orthopedic Tech Progress Note Patient Details:  Anthony Flowers 1961/01/03 [id number]  Casting Type of Cast:  (cast removal) Cast Location: left leg Cast Intervention: Removal     Hildred Priest 02/11/2012, 7:05 PM

## 2012-02-11 NOTE — Progress Notes (Signed)
Orthopedic Tech Progress Note Patient Details:  Anthony Flowers [birth year] [id number]  Patient ID: Anthony Flowers, male   DOB: 1961/04/14, 51 y.o.   MRN: [id number] Viewed order from doctor's order list  Hildred Priest 02/11/2012, 7:05 PM

## 2012-02-23 ENCOUNTER — Encounter (HOSPITAL_BASED_OUTPATIENT_CLINIC_OR_DEPARTMENT_OTHER): Payer: Medicare Other | Attending: General Surgery

## 2012-02-23 DIAGNOSIS — L84 Corns and callosities: Secondary | ICD-10-CM | POA: Insufficient documentation

## 2012-02-23 DIAGNOSIS — Z79899 Other long term (current) drug therapy: Secondary | ICD-10-CM | POA: Insufficient documentation

## 2012-02-23 DIAGNOSIS — E1169 Type 2 diabetes mellitus with other specified complication: Secondary | ICD-10-CM | POA: Insufficient documentation

## 2012-02-23 DIAGNOSIS — S98139A Complete traumatic amputation of one unspecified lesser toe, initial encounter: Secondary | ICD-10-CM | POA: Insufficient documentation

## 2012-02-23 DIAGNOSIS — I10 Essential (primary) hypertension: Secondary | ICD-10-CM | POA: Insufficient documentation

## 2012-02-23 DIAGNOSIS — Z794 Long term (current) use of insulin: Secondary | ICD-10-CM | POA: Insufficient documentation

## 2012-02-23 DIAGNOSIS — E7800 Pure hypercholesterolemia, unspecified: Secondary | ICD-10-CM | POA: Insufficient documentation

## 2012-02-23 DIAGNOSIS — L97509 Non-pressure chronic ulcer of other part of unspecified foot with unspecified severity: Secondary | ICD-10-CM | POA: Insufficient documentation

## 2012-03-03 ENCOUNTER — Encounter (HOSPITAL_COMMUNITY): Payer: Self-pay | Admitting: *Deleted

## 2012-03-03 ENCOUNTER — Emergency Department (HOSPITAL_COMMUNITY)
Admission: EM | Admit: 2012-03-03 | Discharge: 2012-03-03 | Disposition: A | Discharge status: Home / Self Care | Payer: Medicare Other | Attending: Emergency Medicine | Admitting: Emergency Medicine

## 2012-03-03 DIAGNOSIS — E119 Type 2 diabetes mellitus without complications: Secondary | ICD-10-CM | POA: Insufficient documentation

## 2012-03-03 DIAGNOSIS — L0291 Cutaneous abscess, unspecified: Secondary | ICD-10-CM

## 2012-03-03 DIAGNOSIS — I10 Essential (primary) hypertension: Secondary | ICD-10-CM | POA: Insufficient documentation

## 2012-03-03 DIAGNOSIS — F17200 Nicotine dependence, unspecified, uncomplicated: Secondary | ICD-10-CM | POA: Insufficient documentation

## 2012-03-03 DIAGNOSIS — IMO0002 Reserved for concepts with insufficient information to code with codable children: Secondary | ICD-10-CM | POA: Insufficient documentation

## 2012-03-03 DIAGNOSIS — Z794 Long term (current) use of insulin: Secondary | ICD-10-CM | POA: Insufficient documentation

## 2012-03-03 MED ORDER — OXYCODONE-ACETAMINOPHEN 5-325 MG PO TABS: 1.0000 | ORAL_TABLET | Freq: Four times a day (QID) | ORAL | Status: AC | PRN

## 2012-03-03 MED ORDER — OXYCODONE-ACETAMINOPHEN 5-325 MG PO TABS
1.0000 | ORAL_TABLET | Freq: Once | ORAL | Status: AC
  Administered 2012-03-03: 1 via ORAL
  Filled 2012-03-03: qty 1

## 2012-03-03 NOTE — ED Notes (Signed)
Patient is alert and oriented x3.  He is complaining of right under arm pain from an abscessed area that he  States he was suppose to have removed this week.  Pain level is currently 10 of 10.  There is no drainage  Noted from area.

## 2012-03-03 NOTE — Discharge Instructions (Signed)
Followup with your doctor or an urgent care in order to remove your packing in 48-72 hours. You may return to the emergency department if you have  a fever that persists greater than 101 or your abscess appears to become infected (growing surrounding redness and warmth). Do not operate any heavy machinery while on pain medications. Do not consume alcohol on these medications either. ° °Abscess °An abscess (boil or furuncle) is an infected area that contains a collection of pus.  °SYMPTOMS °Signs and symptoms of an abscess include pain, tenderness, redness, or hardness. You may feel a moveable soft area under your skin. An abscess can occur anywhere in the body.  °TREATMENT  °A surgical cut (incision) may be made over your abscess to drain the pus. Gauze may be packed into the space or a drain may be looped through the abscess cavity (pocket). This provides a drain that will allow the cavity to heal from the inside outwards. The abscess may be painful for a few days, but should feel much better if it was drained.  °Your abscess, if seen early, may not have localized and may not have been drained. If not, another appointment may be required if it does not get better on its own or with medications. °HOME CARE INSTRUCTIONS  °· Only take over-the-counter or prescription medicines for pain, discomfort, or fever as directed by your caregiver.  °· Take your antibiotics as directed if they were prescribed. Finish them even if you start to feel better.  °· Keep the skin and clothes clean around your abscess.  °· If the abscess was drained, you will need to use gauze dressing to collect any draining pus. Dressings will typically need to be changed 3 or more times a day.  °· The infection may spread by skin contact with others. Avoid skin contact as much as possible.  °· Practice good hygiene. This includes regular hand washing, cover any draining skin lesions, and do not share personal care items.  °· If you participate in  sports, do not share athletic equipment, towels, whirlpools, or personal care items. Shower after every practice or tournament.  °· If a draining area cannot be adequately covered:  °· Do not participate in sports.  °· Children should not participate in day care until the wound has healed or drainage stops.  °· If your caregiver has given you a follow-up appointment, it is very important to keep that appointment. Not keeping the appointment could result in a much worse infection, chronic or permanent injury, pain, and disability. If there is any problem keeping the appointment, you must call back to this facility for assistance.  °SEEK MEDICAL CARE IF:  °· You develop increased pain, swelling, redness, drainage, or bleeding in the wound site.  °· You develop signs of generalized infection including muscle aches, chills, fever, or a general ill feeling.  °· You have an oral temperature above 102° F (38.9° C).  °MAKE SURE YOU:  °· Understand these instructions.  °· Will watch your condition.  °· Will get help right away if you are not doing well or get worse.  °Document Released: 05/17/2005 Document Revised: 04/19/2011 Document Reviewed: 03/10/2008 °ExitCare® Patient Information ©2012 [employer]. ° ° °
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
## 2012-03-03 NOTE — ED Provider Notes (Signed)
Medical screening examination/treatment/procedure(s) were performed by non-physician practitioner and as supervising physician I was immediately available for consultation/collaboration.  Barbara Cower, MD 03/03/12 [phone]

## 2012-07-29 DIAGNOSIS — E11621 Type 2 diabetes mellitus with foot ulcer: Secondary | ICD-10-CM | POA: Insufficient documentation

## 2014-04-06 DIAGNOSIS — R197 Diarrhea, unspecified: Secondary | ICD-10-CM | POA: Insufficient documentation

## 2014-05-02 DIAGNOSIS — M86671 Other chronic osteomyelitis, right ankle and foot: Secondary | ICD-10-CM | POA: Insufficient documentation

## 2014-05-02 DIAGNOSIS — N183 Chronic kidney disease, stage 3 unspecified: Secondary | ICD-10-CM | POA: Insufficient documentation

## 2014-05-02 DIAGNOSIS — G894 Chronic pain syndrome: Secondary | ICD-10-CM | POA: Insufficient documentation

## 2014-05-12 DIAGNOSIS — Z89439 Acquired absence of unspecified foot: Secondary | ICD-10-CM | POA: Insufficient documentation

## 2014-06-15 DIAGNOSIS — L089 Local infection of the skin and subcutaneous tissue, unspecified: Secondary | ICD-10-CM | POA: Insufficient documentation

## 2014-07-12 DIAGNOSIS — Z89439 Acquired absence of unspecified foot: Secondary | ICD-10-CM | POA: Insufficient documentation

## 2014-07-12 DIAGNOSIS — M86679 Other chronic osteomyelitis, unspecified ankle and foot: Secondary | ICD-10-CM | POA: Insufficient documentation

## 2014-07-12 DIAGNOSIS — M792 Neuralgia and neuritis, unspecified: Secondary | ICD-10-CM | POA: Insufficient documentation

## 2014-08-03 DIAGNOSIS — M8610 Other acute osteomyelitis, unspecified site: Secondary | ICD-10-CM | POA: Insufficient documentation

## 2014-08-03 DIAGNOSIS — M79671 Pain in right foot: Secondary | ICD-10-CM | POA: Insufficient documentation

## 2015-03-27 DIAGNOSIS — G8929 Other chronic pain: Secondary | ICD-10-CM | POA: Insufficient documentation

## 2015-03-27 DIAGNOSIS — M25511 Pain in right shoulder: Secondary | ICD-10-CM | POA: Insufficient documentation

## 2015-06-14 ENCOUNTER — Ambulatory Visit: Payer: Self-pay | Admitting: Podiatry

## 2015-06-21 ENCOUNTER — Other Ambulatory Visit: Payer: Self-pay | Admitting: Internal Medicine

## 2015-06-21 ENCOUNTER — Encounter: Payer: Self-pay | Admitting: Internal Medicine

## 2015-06-21 ENCOUNTER — Ambulatory Visit (INDEPENDENT_AMBULATORY_CARE_PROVIDER_SITE_OTHER): Payer: Medicare Other | Admitting: Internal Medicine

## 2015-06-21 VITALS — BP 160/90 | HR 74 | Temp 98.1°F | Resp 18 | Ht 72.0 in | Wt 256.0 lb

## 2015-06-21 DIAGNOSIS — R0681 Apnea, not elsewhere classified: Secondary | ICD-10-CM | POA: Diagnosis not present

## 2015-06-21 DIAGNOSIS — E1140 Type 2 diabetes mellitus with diabetic neuropathy, unspecified: Secondary | ICD-10-CM | POA: Insufficient documentation

## 2015-06-21 DIAGNOSIS — F17200 Nicotine dependence, unspecified, uncomplicated: Secondary | ICD-10-CM | POA: Insufficient documentation

## 2015-06-21 DIAGNOSIS — F17210 Nicotine dependence, cigarettes, uncomplicated: Secondary | ICD-10-CM | POA: Diagnosis not present

## 2015-06-21 DIAGNOSIS — I10 Essential (primary) hypertension: Secondary | ICD-10-CM | POA: Insufficient documentation

## 2015-06-21 DIAGNOSIS — E1142 Type 2 diabetes mellitus with diabetic polyneuropathy: Secondary | ICD-10-CM

## 2015-06-21 DIAGNOSIS — E119 Type 2 diabetes mellitus without complications: Secondary | ICD-10-CM | POA: Insufficient documentation

## 2015-06-21 DIAGNOSIS — R600 Localized edema: Secondary | ICD-10-CM

## 2015-06-21 DIAGNOSIS — Z794 Long term (current) use of insulin: Secondary | ICD-10-CM

## 2015-06-21 MED ORDER — HYDROCODONE-ACETAMINOPHEN 10-325 MG PO TABS: 1.0000 | ORAL_TABLET | Freq: Four times a day (QID) | ORAL | Status: DC | PRN

## 2015-06-21 MED ORDER — VARENICLINE TARTRATE 0.5 MG X 11 & 1 MG X 42 PO MISC: ORAL | Status: DC

## 2015-06-21 MED ORDER — LISINOPRIL-HYDROCHLOROTHIAZIDE 20-25 MG PO TABS: 1.0000 | ORAL_TABLET | Freq: Every day | ORAL | Status: DC

## 2015-06-21 MED ORDER — ONETOUCH ULTRA BLUE VI STRP: ORAL_STRIP | Status: DC

## 2015-06-21 NOTE — Patient Instructions (Addendum)
   Test(s) ordered today. Your results will be released to [place] (or called to you) after review, usually within 72hours after test completion. If any changes need to be made, you will be notified at that same time.  All other Health Maintenance issues reviewed.   All recommended immunizations and age-appropriate screenings are up-to-date.  No immunizations administered today.   Medications reviewed and updated.  Changes include increasing your blood pressure medication lisinopril-hctz to 20-25 mg and starting chantix.  Your prescription(s) have been submitted to your pharmacy. Please take as directed and contact our office if you believe you are having problem(s) with the medication(s).  A referral was ordered for pulmonary and pain management.   Please schedule followup in 1 month.

## 2015-06-21 NOTE — Assessment & Plan Note (Addendum)
According to her sugars at home. This is well-controlled Blood work ordered-A1c, urine microalbumin Continue current insulin regimen Stressed weight loss and regular exercise Advised to make an eye appt

## 2015-06-21 NOTE — Progress Notes (Signed)
Pre visit review using our clinic review tool, if applicable. No additional management support is needed unless otherwise documented below in the visit note. 
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
## 2015-06-21 NOTE — Assessment & Plan Note (Signed)
Not controlled. He states it was not controlled prior to leaving his last primary care physician and they were about to adjust his medication We will increase hydrochlorothiazide to 25 mg daily, continue lisinopril 20 mg daily and amlodipine 10 mg daily Stressed lifestyle changes-low sodium intake, weight loss and regular exercise Follow-up in one month-if blood pressure is still elevated I will add an additional medication or increase the lisinopril depending on his blood work/kidney function

## 2015-06-22 DIAGNOSIS — R600 Localized edema: Secondary | ICD-10-CM | POA: Insufficient documentation

## 2015-06-22 DIAGNOSIS — R0681 Apnea, not elsewhere classified: Secondary | ICD-10-CM | POA: Insufficient documentation

## 2015-06-22 NOTE — Assessment & Plan Note (Signed)
5 months of b/l leg swelling Increase hctz to 25 mg daily Check cmp Stressed low sodium diet Increase exercise and work on weight loss Follow up in 4 weeks

## 2015-06-22 NOTE — Assessment & Plan Note (Signed)
Currently taking hydrocodone for neuropathy pain, has never tried lyrica or gabapentin Will refill hydrocodone now, but need to consider gabapentin or lyrica Will refer to pain management for their advise regarding pain regimen Will do pain contract and urine testing at next visit Follow up in 4 weeks

## 2015-06-22 NOTE — Assessment & Plan Note (Signed)
He is currently smoking cigarettes, 1ppd.  I have stressed the importance of quitting to help prevent several diseases.  He is interested in quitting.  We spent 3 minutes discussing changing behaviors/habits and tools available to assist in quitting, including nicotine replacement (patches, gum, lozenges), e-cigarettes, vapor, medications.  He has used chantix in the past and tolerated it.  He would like to try it again.  He will follow up at their next appointment in 4 weeks.  chantix starter month sent to pharmacy

## 2015-06-22 NOTE — Assessment & Plan Note (Signed)
Snoring and witnessed apnea at night Possible sleep apnea Will refer to pulmonary for testing

## 2015-06-29 ENCOUNTER — Other Ambulatory Visit (INDEPENDENT_AMBULATORY_CARE_PROVIDER_SITE_OTHER): Payer: Medicare Other

## 2015-06-29 DIAGNOSIS — E1142 Type 2 diabetes mellitus with diabetic polyneuropathy: Secondary | ICD-10-CM

## 2015-06-29 DIAGNOSIS — Z794 Long term (current) use of insulin: Secondary | ICD-10-CM

## 2015-06-29 DIAGNOSIS — I10 Essential (primary) hypertension: Secondary | ICD-10-CM

## 2015-06-29 LAB — COMPREHENSIVE METABOLIC PANEL WITH GFR
ALT: 13 U/L (ref 0–53)
AST: 18 U/L (ref 0–37)
Albumin: 3.6 g/dL (ref 3.5–5.2)
Alkaline Phosphatase: 102 U/L (ref 39–117)
BUN: 19 mg/dL (ref 6–23)
CO2: 30 meq/L (ref 19–32)
Calcium: 10.2 mg/dL (ref 8.4–10.5)
Chloride: 99 meq/L (ref 96–112)
Creatinine, Ser: 1.74 mg/dL — ABNORMAL HIGH (ref 0.40–1.50)
GFR: 52.67 mL/min — ABNORMAL LOW (ref >60.00)
Glucose, Bld: 64 mg/dL — ABNORMAL LOW (ref 70–99)
Potassium: 4.2 meq/L (ref 3.5–5.1)
Sodium: 137 meq/L (ref 135–145)
Total Bilirubin: 0.4 mg/dL (ref 0.2–1.2)
Total Protein: 7.7 g/dL (ref 6.0–8.3)

## 2015-06-29 LAB — CBC WITH DIFFERENTIAL/PLATELET
Basophils Absolute: 0 K/uL (ref 0.0–0.1)
Basophils Relative: 0.3 % (ref 0.0–3.0)
Eosinophils Absolute: 0.3 K/uL (ref 0.0–0.7)
Eosinophils Relative: 2.4 % (ref 0.0–5.0)
HCT: 45.8 % (ref 39.0–52.0)
Hemoglobin: 14.3 g/dL (ref 13.0–17.0)
Lymphocytes Relative: 34.8 % (ref 12.0–46.0)
Lymphs Abs: 3.7 K/uL (ref 0.7–4.0)
MCHC: 31.1 g/dL (ref 30.0–36.0)
MCV: 71.7 fl — ABNORMAL LOW (ref 78.0–100.0)
Monocytes Absolute: 1.1 K/uL — ABNORMAL HIGH (ref 0.1–1.0)
Monocytes Relative: 10 % (ref 3.0–12.0)
Neutro Abs: 5.5 K/uL (ref 1.4–7.7)
Neutrophils Relative %: 52.5 % (ref 43.0–77.0)
Platelets: 492 K/uL — ABNORMAL HIGH (ref 150.0–400.0)
RBC: 6.39 Mil/uL — ABNORMAL HIGH (ref 4.22–5.81)
RDW: 18.1 % — ABNORMAL HIGH (ref 11.5–15.5)
WBC: 10.5 K/uL (ref 4.0–10.5)

## 2015-06-29 LAB — LIPID PANEL
Cholesterol: 204 mg/dL — ABNORMAL HIGH (ref 0–200)
HDL: 35.5 mg/dL — ABNORMAL LOW (ref >39.00)
LDL Cholesterol: 141 mg/dL — ABNORMAL HIGH (ref 0–99)
NonHDL: 168
Total CHOL/HDL Ratio: 6
Triglycerides: 135 mg/dL (ref 0.0–149.0)
VLDL: 27 mg/dL (ref 0.0–40.0)

## 2015-06-29 LAB — TSH: TSH: 1.74 u[IU]/mL (ref 0.35–4.50)

## 2015-06-29 LAB — HEMOGLOBIN A1C: Hgb A1c MFr Bld: 8 % — ABNORMAL HIGH (ref 4.6–6.5)

## 2015-06-30 LAB — MICROALBUMIN / CREATININE URINE RATIO
Creatinine,U: 118 mg/dL
Microalb Creat Ratio: 118 mg/g — ABNORMAL HIGH (ref 0.0–30.0)
Microalb, Ur: 139.2 mg/dL — ABNORMAL HIGH (ref 0.0–1.9)

## 2015-07-16 ENCOUNTER — Telehealth: Payer: Self-pay | Admitting: Emergency Medicine

## 2015-07-16 NOTE — Telephone Encounter (Signed)
Please advise if you would like pt to have UDS at [place] on 07/19/15.

## 2015-07-19 ENCOUNTER — Encounter: Payer: Self-pay | Admitting: Internal Medicine

## 2015-07-19 ENCOUNTER — Ambulatory Visit (INDEPENDENT_AMBULATORY_CARE_PROVIDER_SITE_OTHER): Payer: Medicare Other | Admitting: Internal Medicine

## 2015-07-19 VITALS — BP 128/76 | HR 67 | Temp 98.6°F | Resp 20 | Ht 73.0 in | Wt 256.2 lb

## 2015-07-19 DIAGNOSIS — E1142 Type 2 diabetes mellitus with diabetic polyneuropathy: Secondary | ICD-10-CM | POA: Diagnosis not present

## 2015-07-19 DIAGNOSIS — E785 Hyperlipidemia, unspecified: Secondary | ICD-10-CM | POA: Diagnosis not present

## 2015-07-19 DIAGNOSIS — I10 Essential (primary) hypertension: Secondary | ICD-10-CM | POA: Diagnosis not present

## 2015-07-19 DIAGNOSIS — Z794 Long term (current) use of insulin: Secondary | ICD-10-CM

## 2015-07-19 MED ORDER — VARENICLINE TARTRATE 1 MG PO TABS: 1.0000 mg | ORAL_TABLET | Freq: Two times a day (BID) | ORAL | Status: DC

## 2015-07-19 MED ORDER — ATORVASTATIN CALCIUM 20 MG PO TABS: 20.0000 mg | ORAL_TABLET | Freq: Every day | ORAL | Status: DC

## 2015-07-19 MED ORDER — HYDROCODONE-ACETAMINOPHEN 10-325 MG PO TABS: 1.0000 | ORAL_TABLET | Freq: Four times a day (QID) | ORAL | Status: DC | PRN

## 2015-07-19 NOTE — Progress Notes (Signed)
Pre visit review using our clinic review tool, if applicable. No additional management support is needed unless otherwise documented below in the visit note. 

## 2015-07-19 NOTE — Assessment & Plan Note (Signed)
Lipid panel elevated Multiple risk factors for CAD Not currently on a statin-we will start Lipitor 20 mg daily Stressed regular exercise and weight loss
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
## 2015-07-19 NOTE — Assessment & Plan Note (Addendum)
Taking hydrocodone 10-325 mg every 6 hours as needed Has never tried gabapentin or Lyrica Referral placed for pain management-they are waiting to hear from them. He declined trying to gabapentin or Lyrica today We'll refer to podiatry Urine drug screen done today

## 2015-07-19 NOTE — Assessment & Plan Note (Signed)
Blood pressure controlled here today Continue current medications Recheck kidney function in 3 months Stressed the importance of increasing his exercise and losing weight

## 2015-07-19 NOTE — Patient Instructions (Signed)
We will start atorvastatin for your cholesterol.  Take as directed and let me know if you have any side effects.   chantix was sent to your pharmacy.   Referrals were ordered for podiatry and endocrine.   Start exercising and work on weight loss.  Your pain medication was refilled.    Follow up in 3 months

## 2015-07-19 NOTE — Assessment & Plan Note (Signed)
A1c 8.0% Stressed the importance of increasing his exercise and working on weight loss Since he is compliant with a diabetic diet Most of his elevated sugars are related to noncompliance He seems motivated to make lifestyle changes so I will hold off on making any changes to his medication He did request seen an endocrinologist-we'll refer

## 2015-07-22 ENCOUNTER — Ambulatory Visit (INDEPENDENT_AMBULATORY_CARE_PROVIDER_SITE_OTHER): Payer: Medicare Other | Admitting: Endocrinology

## 2015-07-22 ENCOUNTER — Encounter: Payer: Self-pay | Admitting: Endocrinology

## 2015-07-22 VITALS — BP 148/86 | HR 79 | Temp 98.5°F | Ht 73.0 in | Wt 256.0 lb

## 2015-07-22 DIAGNOSIS — Z794 Long term (current) use of insulin: Secondary | ICD-10-CM | POA: Diagnosis not present

## 2015-07-22 DIAGNOSIS — E1142 Type 2 diabetes mellitus with diabetic polyneuropathy: Secondary | ICD-10-CM
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
## 2015-07-22 NOTE — Patient Instructions (Addendum)
good diet and exercise significantly improve the control of your diabetes.  please let me know if you wish to be referred to a dietician.  high blood sugar is very risky to your health.  you should see an eye doctor and dentist every year.  It is very important to get all recommended vaccinations.  controlling your blood pressure and cholesterol drastically reduces the damage diabetes does to your body.  Those who smoke should quit.  please discuss these with your doctor.  check your blood sugar twice a day.  vary the time of day when you check, between before the 3 meals, and at bedtime.  also check if you have symptoms of your blood sugar being too high or too low.  please keep a record of the readings and bring it to your next appointment here (or you can bring the meter itself).  You can write it on any piece of paper.  please call us sooner if your blood sugar goes below 70, or if you have a lot of readings over 200. Please reduce the lantus to 40 units at bedtime, and: Increase the novolog to 12 units 3 times a day (just before each meal, no matter what the blood sugar is).   Please come back for a follow-up appointment in 2 weeks.

## 2015-07-30 ENCOUNTER — Encounter: Payer: Self-pay | Admitting: Internal Medicine

## 2015-08-02 ENCOUNTER — Ambulatory Visit (INDEPENDENT_AMBULATORY_CARE_PROVIDER_SITE_OTHER): Payer: Medicare (Managed Care)

## 2015-08-02 ENCOUNTER — Encounter: Payer: Self-pay | Admitting: Podiatry

## 2015-08-02 ENCOUNTER — Ambulatory Visit (INDEPENDENT_AMBULATORY_CARE_PROVIDER_SITE_OTHER): Payer: Medicare (Managed Care) | Admitting: Podiatry

## 2015-08-02 VITALS — BP 138/103 | HR 78 | Resp 18

## 2015-08-02 DIAGNOSIS — E1142 Type 2 diabetes mellitus with diabetic polyneuropathy: Secondary | ICD-10-CM | POA: Diagnosis not present

## 2015-08-02 DIAGNOSIS — R52 Pain, unspecified: Secondary | ICD-10-CM | POA: Diagnosis not present

## 2015-08-02 DIAGNOSIS — Z899 Acquired absence of limb, unspecified: Secondary | ICD-10-CM

## 2015-08-02 DIAGNOSIS — L84 Corns and callosities: Secondary | ICD-10-CM

## 2015-08-02 DIAGNOSIS — E1149 Type 2 diabetes mellitus with other diabetic neurological complication: Secondary | ICD-10-CM

## 2015-08-02 DIAGNOSIS — M79676 Pain in unspecified toe(s): Secondary | ICD-10-CM

## 2015-08-02 DIAGNOSIS — B351 Tinea unguium: Secondary | ICD-10-CM

## 2015-08-02 NOTE — Progress Notes (Signed)
   Subjective:    Patient ID: Anthony Flowers, male    DOB: [birth year], 54 y.o.   MRN: [id number]  HPI  54 year old male presents the office today for diabetic risk assessment and for painful calluses to both his feet. He previously has undergone partial fifth ray amputations both of his feet as well as a partial toe amputation previously. He does continue to get calluses over this feet which become painful. He denies any drainage or redness to the area. He does have that he has chronic pain to both of his feet and he was previous a seen Dr. Rolley Sims and she was prescribing Vicodin. He is asking for refill of medicine today. Denies any recent injury or trauma. No redness or warmth of the feet. He does get some intermittent mild swelling to his right foot although it appears to be improving. No other complaints at this time.   Review of Systems  All other systems reviewed and are negative.      Objective:   Physical Exam General: AAO x3, NAD  Dermatological: Hyperkeratotic lesions left foot submetatarsal 1, distal second toe, submetatarsal 4 on the right foot lateral submetatarsal 5. Upon debridement there is no underlying ulceration, drainage or other signs of infection. Nails appear to be somewhat hypertrophic, dystrophic, brittle, discolored 7. There is irritation to the toenails daily with shoe gear. No swelling erythema or drainage. Grossly intact via light touch bilateral. Vibratory intact via tuning fork bilateral. Protective threshold with Semmes Wienstein monofilament intact to all pedal sites bilateral. Patellar and Achilles deep tendon reflexes 2+ bilateral. No Babinski or clonus noted bilateral.   Vascular: Dorsalis PediSensation decreased with Simms Weinstein monofilament, decreased vibratory sensation.   Musculoskeletal: Previous partial indication of lesser digit as well as bilateral partial fifth ray. No other areas of tenderness to bilateral lower extremities. No pain,  crepitus, or limitation noted with foot and ankle range of motion bilateral. Muscular strength 5/5 in all groups tested bilateral.  Gait: Unassisted, Nonantalgic.       Assessment & Plan:  54 year old male with bilateral pre-ulcerative calluses with history of ulceration, neuropathy -Treatment options discussed including all alternatives, risks, and complications -X-rays were obtained and reviewed with the patient.  -Hyperkeratotic lesions debrided 4 without complication/bleeding. -I do believe he will benefit from diabetic shoes and inserts. I completed paperwork today for precertification. -He was asked anything for pain medicine. However he just recently had Vicodin 10/325 prescribed on November 28 and had 120 tablets dispensed. This was prescribed by Dr. Quay Burow. He should follow up with her that I will not be writing pain medicine if she is doing this as well. -Follow-up as scheduled or sooner if any problems arise. In the meantime, encouraged to call the office with any questions, concerns, change in symptoms.   Celesta Gentile, DPM

## 2015-08-03 ENCOUNTER — Encounter: Payer: Self-pay | Admitting: Podiatry

## 2015-08-03 DIAGNOSIS — E1149 Type 2 diabetes mellitus with other diabetic neurological complication: Secondary | ICD-10-CM | POA: Insufficient documentation

## 2015-08-03 DIAGNOSIS — L84 Corns and callosities: Secondary | ICD-10-CM | POA: Insufficient documentation

## 2015-08-03 DIAGNOSIS — M79609 Pain in unspecified limb: Secondary | ICD-10-CM | POA: Insufficient documentation

## 2015-08-03 DIAGNOSIS — Z899 Acquired absence of limb, unspecified: Secondary | ICD-10-CM | POA: Insufficient documentation

## 2015-08-03 DIAGNOSIS — B351 Tinea unguium: Secondary | ICD-10-CM | POA: Insufficient documentation

## 2015-08-05 ENCOUNTER — Other Ambulatory Visit: Payer: Self-pay

## 2015-08-05 ENCOUNTER — Ambulatory Visit (INDEPENDENT_AMBULATORY_CARE_PROVIDER_SITE_OTHER): Payer: Medicare Other | Admitting: Endocrinology

## 2015-08-05 ENCOUNTER — Encounter: Payer: Self-pay | Admitting: Endocrinology

## 2015-08-05 VITALS — BP 136/87 | HR 80 | Temp 97.9°F | Ht 73.0 in | Wt 257.0 lb

## 2015-08-05 DIAGNOSIS — E1142 Type 2 diabetes mellitus with diabetic polyneuropathy: Secondary | ICD-10-CM | POA: Diagnosis not present

## 2015-08-05 DIAGNOSIS — Z794 Long term (current) use of insulin: Secondary | ICD-10-CM

## 2015-08-05 MED ORDER — LANTUS SOLOSTAR 100 UNIT/ML ~~LOC~~ SOPN: 30.0000 [IU] | PEN_INJECTOR | Freq: Every day | SUBCUTANEOUS | Status: DC

## 2015-08-05 MED ORDER — INSULIN ASPART 100 UNIT/ML FLEXPEN: 18.0000 [IU] | PEN_INJECTOR | Freq: Three times a day (TID) | SUBCUTANEOUS | Status: DC

## 2015-08-05 MED ORDER — ONETOUCH ULTRA BLUE VI STRP: ORAL_STRIP | Status: DC
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
## 2015-08-05 NOTE — Patient Instructions (Addendum)
check your blood sugar twice a day.  vary the time of day when you check, between before the 3 meals, and at bedtime.  also check if you have symptoms of your blood sugar being too high or too low.  please keep a record of the readings and bring it to your next appointment here (or you can bring the meter itself).  You can write it on any piece of paper.  please call us sooner if your blood sugar goes below 70, or if you have a lot of readings over 200. Please reduce the lantus to 30 units at bedtime, and:  Increase the novolog to 18 units 3 times a day (just before each meal, no matter what the blood sugar is).   Please come back for a follow-up appointment in 1 month.

## 2015-08-06 ENCOUNTER — Other Ambulatory Visit: Payer: Self-pay

## 2015-08-06 MED ORDER — ONETOUCH ULTRA BLUE VI STRP: ORAL_STRIP | Status: DC

## 2015-08-17 ENCOUNTER — Telehealth: Payer: Self-pay

## 2015-08-17 MED ORDER — HYDROCODONE-ACETAMINOPHEN 10-325 MG PO TABS: 1.0000 | ORAL_TABLET | Freq: Four times a day (QID) | ORAL | Status: DC | PRN

## 2015-08-17 NOTE — Addendum Note (Signed)
Addended by: Binnie Rail on: 08/17/2015 04:52 PM   Modules accepted: Orders

## 2015-08-17 NOTE — Telephone Encounter (Signed)
Patient is requesting rx refill on pain med, hydrocodone----please advise, thanks

## 2015-08-17 NOTE — Telephone Encounter (Signed)
rx printed

## 2015-08-18 NOTE — Telephone Encounter (Signed)
Spoke with pt to inform.  

## 2015-08-27 ENCOUNTER — Ambulatory Visit: Payer: Medicare (Managed Care) | Admitting: Podiatry

## 2015-09-07 ENCOUNTER — Ambulatory Visit (INDEPENDENT_AMBULATORY_CARE_PROVIDER_SITE_OTHER): Payer: Medicare (Managed Care) | Admitting: Endocrinology

## 2015-09-07 ENCOUNTER — Encounter: Payer: Self-pay | Admitting: Endocrinology

## 2015-09-07 VITALS — BP 124/78 | HR 79 | Temp 98.7°F | Wt 259.0 lb

## 2015-09-07 DIAGNOSIS — E1149 Type 2 diabetes mellitus with other diabetic neurological complication: Secondary | ICD-10-CM

## 2015-09-07 LAB — POCT GLYCOSYLATED HEMOGLOBIN (HGB A1C): Hemoglobin A1C: 8.2

## 2015-09-07 MED ORDER — GLUCOSE BLOOD VI STRP: 1.0000 | ORAL_STRIP | Freq: Three times a day (TID) | Status: AC

## 2015-09-07 MED ORDER — LANTUS SOLOSTAR 100 UNIT/ML ~~LOC~~ SOPN: 20.0000 [IU] | PEN_INJECTOR | Freq: Every day | SUBCUTANEOUS | Status: AC

## 2015-09-07 MED ORDER — INSULIN ASPART 100 UNIT/ML FLEXPEN: 25.0000 [IU] | PEN_INJECTOR | Freq: Three times a day (TID) | SUBCUTANEOUS | Status: AC

## 2015-09-07 NOTE — Patient Instructions (Addendum)
check your blood sugar twice a day.  vary the time of day when you check, between before the 3 meals, and at bedtime.  also check if you have symptoms of your blood sugar being too high or too low.  please keep a record of the readings and bring it to your next appointment here (or you can bring the meter itself).  You can write it on any piece of paper.  please call us sooner if your blood sugar goes below 70, or if you have a lot of readings over 200. Please reduce the lantus to 20 units at bedtime, and:  Increase the novolog to 25 units 3 times a day (just before each meal, no matter what the blood sugar is).   Please come back for a follow-up appointment in 1 month.
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
## 2015-09-13 ENCOUNTER — Telehealth: Payer: Self-pay | Admitting: *Deleted

## 2015-09-13 MED ORDER — HYDROCODONE-ACETAMINOPHEN 10-325 MG PO TABS: 1.0000 | ORAL_TABLET | Freq: Four times a day (QID) | ORAL | Status: DC | PRN

## 2015-09-13 NOTE — Telephone Encounter (Signed)
Pt requesting refill on his hydrocodone...Anthony Flowers

## 2015-09-13 NOTE — Telephone Encounter (Signed)
rx printed

## 2015-09-14 NOTE — Telephone Encounter (Signed)
Pt has been notified RX is ready for pick-up

## 2015-09-27 ENCOUNTER — Telehealth: Payer: Self-pay | Admitting: Internal Medicine

## 2015-09-27 MED ORDER — VARENICLINE TARTRATE 1 MG PO TABS: 1.0000 mg | ORAL_TABLET | Freq: Two times a day (BID) | ORAL | Status: DC

## 2015-09-27 MED ORDER — SILDENAFIL CITRATE 100 MG PO TABS: 50.0000 mg | ORAL_TABLET | Freq: Every day | ORAL | Status: DC | PRN

## 2015-09-27 NOTE — Telephone Encounter (Signed)
Patient is requesting chantix to be sent to [facility] on [address]  Patient also states that his previous provider had him on viagra.  Patient would like to know if this can be sent or does he need to come in.

## 2015-09-27 NOTE — Telephone Encounter (Signed)
Both prescriptions sent to pharmacy -- he has an appt later this month I believe.

## 2015-09-27 NOTE — Telephone Encounter (Signed)
Please advise 

## 2015-10-04 ENCOUNTER — Ambulatory Visit (INDEPENDENT_AMBULATORY_CARE_PROVIDER_SITE_OTHER): Payer: Medicare (Managed Care) | Admitting: Podiatry

## 2015-10-04 ENCOUNTER — Encounter: Payer: Self-pay | Admitting: Podiatry

## 2015-10-04 VITALS — BP 172/81 | HR 79 | Resp 16

## 2015-10-04 DIAGNOSIS — E1149 Type 2 diabetes mellitus with other diabetic neurological complication: Secondary | ICD-10-CM | POA: Diagnosis not present

## 2015-10-04 DIAGNOSIS — M79676 Pain in unspecified toe(s): Secondary | ICD-10-CM

## 2015-10-04 DIAGNOSIS — B351 Tinea unguium: Secondary | ICD-10-CM

## 2015-10-04 DIAGNOSIS — L84 Corns and callosities: Secondary | ICD-10-CM | POA: Diagnosis not present

## 2015-10-04 NOTE — Progress Notes (Signed)
Patient ID: Anthony Flowers, male   DOB: Jan 05, 1961, 55 y.o.   MRN: [id number]  Subjective: 55 y.o. returns the office today for painful, elongated, thickened toenails which he cannot trim himself. Denies any redness or drainage around the nails. Also has multiple pre-ulcerative calluses. Denies any acute changes since last appointment and no new complaints today. Denies any systemic complaints such as fevers, chills, nausea, vomiting.   Objective: AAO 3, NAD DP/PT pulses palpable, CRT less than 3 seconds Nails hypertrophic, dystrophic, elongated, brittle, discolored 7. There is tenderness overlying the nails 1-7 bilaterally. There is no surrounding erythema or drainage along the nail sites. No open lesions or pre-ulcerative lesions are identified. Previous partial second toe and bilateral fifth partial ray amputations. Hyperkeratotic lesions overlying submetatarsal on the left foot as well as the distal second toe into the indications of the right fifth metatarsal. Upon debridement there is no underlying ulceration, drainage or other signs of infection. No other areas of tenderness bilateral lower extremities. No overlying edema, erythema, increased warmth. No pain with calf compression, swelling, warmth, erythema.  Assessment: Patient presents with symptomatic onychomycosis; hyperkeratotic lesions  Plan: -Treatment options including alternatives, risks, complications were discussed -Nails sharply debrided 7 without complication/bleeding. -Pre-ulcerative calluses debrided without complications or bleeding. -Discussed daily foot inspection. If there are any changes, to call the office immediately.  -Follow-up in 3 months or sooner if any problems are to arise. In the meantime, encouraged to call the office with any questions, concerns, changes symptoms.  Celesta Gentile, DPM

## 2015-10-06 ENCOUNTER — Ambulatory Visit (INDEPENDENT_AMBULATORY_CARE_PROVIDER_SITE_OTHER): Payer: Commercial Managed Care - HMO | Admitting: Pulmonary Disease

## 2015-10-06 ENCOUNTER — Encounter: Payer: Self-pay | Admitting: Pulmonary Disease

## 2015-10-06 VITALS — BP 130/86 | HR 67 | Ht 73.0 in | Wt 259.6 lb

## 2015-10-06 DIAGNOSIS — G4733 Obstructive sleep apnea (adult) (pediatric): Secondary | ICD-10-CM | POA: Diagnosis not present

## 2015-10-06 DIAGNOSIS — G4730 Sleep apnea, unspecified: Secondary | ICD-10-CM | POA: Diagnosis not present

## 2015-10-06 NOTE — Assessment & Plan Note (Signed)
Given excessive daytime somnolence, narrow pharyngeal exam, witnessed apneas & loud snoring, obstructive sleep apnea is very likely & an overnight polysomnogram will be scheduled as a home study. The pathophysiology of obstructive sleep apnea , it's cardiovascular consequences & modes of treatment including CPAP were discused with the patient in detail & they evidenced understanding. He will likely require a CPAP titration subsequently has pretest probability is high
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
## 2015-10-06 NOTE — Patient Instructions (Signed)
Home sleep study 

## 2015-10-07 ENCOUNTER — Ambulatory Visit: Payer: Medicare Other | Admitting: Endocrinology

## 2015-10-08 ENCOUNTER — Telehealth: Payer: Self-pay | Admitting: Endocrinology

## 2015-10-08 NOTE — Telephone Encounter (Signed)
Patient no showed today's appt. Please advise on how to follow up. °A. No follow up necessary. °B. Follow up urgent. Contact patient immediately. °C. Follow up necessary. Contact patient and schedule visit in ___ days. °D. Follow up advised. Contact patient and schedule visit in ____weeks. ° °

## 2015-10-09 NOTE — Telephone Encounter (Signed)
Please come back for a follow-up appointment in 2 weeks

## 2015-10-11 NOTE — Telephone Encounter (Signed)
Caitlin, Could you contact the pt and reschedule for 2 weeks.  Thanks!

## 2015-10-15 ENCOUNTER — Telehealth: Payer: Self-pay | Admitting: *Deleted

## 2015-10-15 NOTE — Telephone Encounter (Addendum)
Pt's phone cut him off.  I spoke with pt later and he said he spoke to someone about his diabetic shoes, and he wanted compression hose.  I told him I would send a message to his doctor and call again about the hose.  10/18/2015-INFORMED PT DR. Jacqualyn Posey ordered the light weight compression hose, gave address to the [facility] on [address] and phone line.  Faxed orders to [facility] [phone].

## 2015-10-18 ENCOUNTER — Telehealth: Payer: Self-pay | Admitting: Internal Medicine

## 2015-10-18 MED ORDER — HYDROCODONE-ACETAMINOPHEN 10-325 MG PO TABS: 1.0000 | ORAL_TABLET | Freq: Four times a day (QID) | ORAL | Status: DC | PRN

## 2015-10-18 NOTE — Telephone Encounter (Signed)
Pt has an appt with [place] tomorrow 10/19/15. Okay to refill?

## 2015-10-18 NOTE — Telephone Encounter (Signed)
Pt requesting refill for HYDROcodone-acetaminophen (NORCO) 10-325 MG tablet [id number]

## 2015-10-18 NOTE — Telephone Encounter (Signed)
Ok to refill - remind him of appt if you speak to him.

## 2015-10-18 NOTE — Telephone Encounter (Signed)
RX printed. Pt has been informed of RX and reminded of his appt tomorrow.

## 2015-10-18 NOTE — Telephone Encounter (Signed)
Will need to check with [place] about the shoes. Can do a light knee high compression stocking.

## 2015-10-19 ENCOUNTER — Encounter: Payer: Commercial Managed Care - HMO | Admitting: Internal Medicine

## 2015-10-19 DIAGNOSIS — K219 Gastro-esophageal reflux disease without esophagitis: Secondary | ICD-10-CM | POA: Insufficient documentation
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
## 2015-10-22 ENCOUNTER — Telehealth: Payer: Self-pay | Admitting: Pulmonary Disease

## 2015-10-22 NOTE — Telephone Encounter (Signed)
FYI - Pt was scheduled to pick up HST yesterday - he didn't show.  I called this morning to find out why he didn't come & he states he is moving to [place].  He is going to try to find doctor up there & will request his records.

## 2015-10-28 ENCOUNTER — Ambulatory Visit: Payer: Commercial Managed Care - HMO | Admitting: Endocrinology

## 2015-11-05 ENCOUNTER — Telehealth: Payer: Self-pay | Admitting: *Deleted

## 2015-11-05 NOTE — Telephone Encounter (Signed)
Called patient to schedule an appointment for diabetic shoe measurement.  Patient notified me he has moved back to [place] and to please cancel his appointment.

## 2015-12-06 ENCOUNTER — Ambulatory Visit: Payer: Medicare (Managed Care) | Admitting: Podiatry

## 2016-09-26 DIAGNOSIS — R809 Proteinuria, unspecified: Secondary | ICD-10-CM | POA: Insufficient documentation

## 2016-09-26 DIAGNOSIS — E871 Hypo-osmolality and hyponatremia: Secondary | ICD-10-CM | POA: Insufficient documentation

## 2016-09-26 DIAGNOSIS — N189 Chronic kidney disease, unspecified: Secondary | ICD-10-CM | POA: Insufficient documentation

## 2016-09-26 DIAGNOSIS — D631 Anemia in chronic kidney disease: Secondary | ICD-10-CM | POA: Insufficient documentation

## 2016-09-26 DIAGNOSIS — N250 Renal osteodystrophy: Secondary | ICD-10-CM | POA: Insufficient documentation

## 2016-09-26 DIAGNOSIS — N2581 Secondary hyperparathyroidism of renal origin: Secondary | ICD-10-CM | POA: Insufficient documentation

## 2017-01-10 DIAGNOSIS — R2243 Localized swelling, mass and lump, lower limb, bilateral: Secondary | ICD-10-CM | POA: Insufficient documentation

## 2017-01-10 DIAGNOSIS — Z720 Tobacco use: Secondary | ICD-10-CM | POA: Insufficient documentation

## 2017-01-29 DIAGNOSIS — L821 Other seborrheic keratosis: Secondary | ICD-10-CM | POA: Insufficient documentation

## 2018-05-15 ENCOUNTER — Ambulatory Visit: Payer: Commercial Managed Care - HMO | Admitting: Family

## 2018-05-15 DIAGNOSIS — Z0289 Encounter for other administrative examinations: Secondary | ICD-10-CM

## 2018-05-20 ENCOUNTER — Ambulatory Visit (INDEPENDENT_AMBULATORY_CARE_PROVIDER_SITE_OTHER): Payer: Medicare Other | Admitting: Podiatry

## 2018-05-20 ENCOUNTER — Ambulatory Visit (INDEPENDENT_AMBULATORY_CARE_PROVIDER_SITE_OTHER): Payer: Medicare Other

## 2018-05-20 ENCOUNTER — Encounter: Payer: Self-pay | Admitting: Podiatry

## 2018-05-20 ENCOUNTER — Other Ambulatory Visit: Payer: Self-pay | Admitting: Podiatry

## 2018-05-20 VITALS — BP 130/80 | HR 80 | Temp 97.7°F | Resp 16

## 2018-05-20 DIAGNOSIS — Z89422 Acquired absence of other left toe(s): Secondary | ICD-10-CM | POA: Diagnosis not present

## 2018-05-20 DIAGNOSIS — E08621 Diabetes mellitus due to underlying condition with foot ulcer: Secondary | ICD-10-CM

## 2018-05-20 DIAGNOSIS — L97529 Non-pressure chronic ulcer of other part of left foot with unspecified severity: Secondary | ICD-10-CM

## 2018-05-20 MED ORDER — DOXYCYCLINE HYCLATE 100 MG PO TABS: 100.0000 mg | ORAL_TABLET | Freq: Two times a day (BID) | ORAL | 0 refills | Status: DC

## 2018-05-20 NOTE — Progress Notes (Signed)
Subjective: 57 year old male presents the office today for concerns of a wound on the left foot submetatarsal 1 is been on about 2 weeks.  He recently did see his primary care physician as well as a podiatrist.  He is currently not taking any antibiotics has had no significant treatment with the area.  Since I last saw him he recently just had a fourth toe amputation of the left foot as well which did heal uneventfully.  He states his last A1c was around 8 his blood sugar this morning was 196.  He denies any drainage or pus coming from his new area but there has been no odor his wife states. Denies any systemic complaints such as fevers, chills, nausea, vomiting. No acute changes since last appointment, and no other complaints at this time.   Objective: AAO x3, NAD DP/PT pulses palpable bilaterally, CRT less than 3 seconds Left fifth metatarsal 1 is a hyperkeratotic lesion with mild malodor.  Upon debridement large macerated area underneath the callus.  There is no drainage or pus coming out in the area at this time.  There is no swelling erythema, ascending cellulitis.  There is minimal edema.  During the debridement there is more malodor present this is from a macerated type smell.  There is no probing, undermining or tunneling.  There is no surrounding fluctuation or crepitation. No open lesions or pre-ulcerative lesions.  No pain with calf compression, swelling, warmth, erythema  Assessment: Ulceration left foot  Plan: -All treatment options discussed with the patient including all alternatives, risks, complications.  -Transfer obtained reviewed.  A marker of soft tissue breakdown.  There is a radiolucency present in the proximal phalanx base.  No x-rays to compare this to.  This could be due to arthritic changes versus early osteomyelitis. -Sharply debrided the wound without any complications utilizing #017 blade scalpel as well as a tissue nipper.  Given macerated tissue putting Betadine on him  to continue this daily.  We will can start doxycycline I prescribed this today.  I put him into a darco shoe with a PegAssist.  -Will check ESR, CRP, CBC -RTC 1 week or sooner if needed -Patient encouraged to call the office with any questions, concerns, change in symptoms.   *Repeat x-rays next appointment  Trula Slade DPM

## 2018-05-23 LAB — CBC WITH DIFFERENTIAL/PLATELET
Basophils Absolute: 40 {cells}/uL (ref 0–200)
Basophils Relative: 0.6 %
Eosinophils Absolute: 172 {cells}/uL (ref 15–500)
Eosinophils Relative: 2.6 %
HCT: 44.3 % (ref 38.5–50.0)
Hemoglobin: 13.4 g/dL (ref 13.2–17.1)
Lymphs Abs: 2211 {cells}/uL (ref 850–3900)
MCH: 22.3 pg — ABNORMAL LOW (ref 27.0–33.0)
MCHC: 30.2 g/dL — ABNORMAL LOW (ref 32.0–36.0)
MCV: 73.7 fL — ABNORMAL LOW (ref 80.0–100.0)
MPV: 10.3 fL (ref 7.5–12.5)
Monocytes Relative: 8.2 %
Neutro Abs: 3637 {cells}/uL (ref 1500–7800)
Neutrophils Relative %: 55.1 %
Platelets: 399 Thousand/uL (ref 140–400)
RBC: 6.01 Million/uL — ABNORMAL HIGH (ref 4.20–5.80)
RDW: 16 % — ABNORMAL HIGH (ref 11.0–15.0)
Total Lymphocyte: 33.5 %
WBC mixed population: 541 {cells}/uL (ref 200–950)
WBC: 6.6 Thousand/uL (ref 3.8–10.8)

## 2018-05-23 LAB — BASIC METABOLIC PANEL WITH GFR
BUN/Creatinine Ratio: 10 (calc) (ref 6–22)
BUN: 21 mg/dL (ref 7–25)
CO2: 25 mmol/L (ref 20–32)
Calcium: 9.5 mg/dL (ref 8.6–10.3)
Chloride: 97 mmol/L — ABNORMAL LOW (ref 98–110)
Creat: 2.02 mg/dL — ABNORMAL HIGH (ref 0.70–1.33)
Glucose, Bld: 309 mg/dL — ABNORMAL HIGH (ref 65–139)
Potassium: 4.7 mmol/L (ref 3.5–5.3)
Sodium: 130 mmol/L — ABNORMAL LOW (ref 135–146)

## 2018-05-23 LAB — SEDIMENTATION RATE: Sed Rate: 17 mm/h (ref 0–20)

## 2018-05-23 LAB — C-REACTIVE PROTEIN: CRP: 2.3 mg/L (ref <8.0)

## 2018-05-24 LAB — WOUND CULTURE
MICRO NUMBER:: 91171417
SPECIMEN QUALITY:: ADEQUATE

## 2018-05-27 ENCOUNTER — Ambulatory Visit (INDEPENDENT_AMBULATORY_CARE_PROVIDER_SITE_OTHER): Payer: Medicare Other | Admitting: Podiatry

## 2018-05-27 ENCOUNTER — Ambulatory Visit (INDEPENDENT_AMBULATORY_CARE_PROVIDER_SITE_OTHER): Payer: Medicare Other

## 2018-05-27 ENCOUNTER — Telehealth: Payer: Self-pay | Admitting: *Deleted

## 2018-05-27 ENCOUNTER — Encounter: Payer: Self-pay | Admitting: Podiatry

## 2018-05-27 DIAGNOSIS — L97529 Non-pressure chronic ulcer of other part of left foot with unspecified severity: Secondary | ICD-10-CM | POA: Diagnosis not present

## 2018-05-27 DIAGNOSIS — A4902 Methicillin resistant Staphylococcus aureus infection, unspecified site: Secondary | ICD-10-CM

## 2018-05-27 MED ORDER — DOXYCYCLINE HYCLATE 100 MG PO TABS: 100.0000 mg | ORAL_TABLET | Freq: Two times a day (BID) | ORAL | 0 refills | Status: DC

## 2018-05-27 MED ORDER — MUPIROCIN 2 % EX OINT: 1.0000 "application " | TOPICAL_OINTMENT | Freq: Two times a day (BID) | CUTANEOUS | 2 refills | Status: DC

## 2018-05-27 NOTE — Telephone Encounter (Signed)
Dr. Jacqualyn Posey ordered sterile gauze, tape, gloves, and saline for daily wound care of right foot sub 1st ulcer, measuring 2.0 x 2.0 x 0.1cm with low exudate, L97.529 from [facility].

## 2018-05-28 NOTE — Progress Notes (Signed)
Yes sir. I will get it put up front to be mailed out today.

## 2018-05-28 NOTE — Progress Notes (Signed)
Subjective: 57 year old male presents the office today for follow-up evaluation with his left foot.  He states he is doing much better and the wound appears to be healed he has not had any drainage or pus or any malodor coming from the area.  He has remained on the antibiotics.  He is also been continue with a surgical shoe and offloading.  He has no other concerns today he is doing well. Denies any systemic complaints such as fevers, chills, nausea, vomiting. No acute changes since last appointment, and no other complaints at this time.   Objective: AAO x3, NAD DP/PT pulses palpable bilaterally, CRT less than 3 seconds On the left foot the metatarsal with hyperkeratotic lesion.  Upon debridement the wound appears to be almost fully healed only very small superficial abrasion type lesions present.  There is no probing, undermining or tunneling there is no surrounding erythema, ascending cellulitis.  There is no fluctuation or crepitus or any malodor.  Overall the foot is doing much better than it was last appointment.  No open lesions or pre-ulcerative lesions.  No pain with calf compression, swelling, warmth, erythema  Assessment: Ulceration with improvement left foot, MRSA infection  Plan: -All treatment options discussed with the patient including all alternatives, risks, complications.  -I reviewed the cultures with him as well as the blood work.  Given his history mother continue antibiotics.  I refilled the doxycycline for him today.  Discussed blood work results and reviewed this over to his primary care physician to review given his kidney function. -Continue with surgical shoe with offloading at all times. -Continue antibiotic ointment to the wound daily. -Overall he is doing much better there is no signs of infection today going to continue to monitor for any recurrence.  Call the office to the emergency room should any occur he verbally understood. -Patient encouraged to call the office  with any questions, concerns, change in symptoms.   Trula Slade DPM

## 2018-06-10 ENCOUNTER — Ambulatory Visit (INDEPENDENT_AMBULATORY_CARE_PROVIDER_SITE_OTHER): Payer: Medicare Other | Admitting: Podiatry

## 2018-06-10 ENCOUNTER — Encounter: Payer: Self-pay | Admitting: Podiatry

## 2018-06-10 DIAGNOSIS — M79674 Pain in right toe(s): Secondary | ICD-10-CM

## 2018-06-10 DIAGNOSIS — L97529 Non-pressure chronic ulcer of other part of left foot with unspecified severity: Secondary | ICD-10-CM | POA: Diagnosis not present

## 2018-06-10 DIAGNOSIS — B351 Tinea unguium: Secondary | ICD-10-CM

## 2018-06-10 DIAGNOSIS — E1149 Type 2 diabetes mellitus with other diabetic neurological complication: Secondary | ICD-10-CM

## 2018-06-10 DIAGNOSIS — Q828 Other specified congenital malformations of skin: Secondary | ICD-10-CM

## 2018-06-10 DIAGNOSIS — M79675 Pain in left toe(s): Secondary | ICD-10-CM | POA: Diagnosis not present

## 2018-06-10 DIAGNOSIS — A4902 Methicillin resistant Staphylococcus aureus infection, unspecified site: Secondary | ICD-10-CM

## 2018-06-12 NOTE — Progress Notes (Signed)
Subjective: 57 year old male presents the office his wife for follow-up evaluation of a wound to his left foot.  He says the area is healed he has not had any odor, swelling, drainage or any pus and there is new skin, callus overlying the area.  He has no pain.  Is also asking for his nails be trimmed as they are thick and elongated causing discomfort.  He also gets calluses to his feet that he did have trimmed.  No other concerns. Denies any systemic complaints such as fevers, chills, nausea, vomiting. No acute changes since last appointment, and no other complaints at this time.   Objective: AAO x3, NAD DP/PT pulses palpable bilaterally, CRT less than 3 seconds Sensation decreased with Simms Weinstein monofilament  Previous fourth and fifth partial ray amputations bilaterally.   The wound on the left foot submetatarsal 1 appears to be healed.  Hyperkeratotic tissues overlying the area and upon debridement the underlying skin is intact without any openings.  There is no edema, erythema, drainage or pus there is no clinical signs of infection.   Nails are hypertrophic, dystrophic with yellow to brown discoloration to nails 1-3 bilaterally.  Subjectively the nails are causing irritation but there is no redness or drainage or any signs of infection present.  Calluses submetatarsal 4 area bilaterally.  Upon debridement there is no underlying ulceration, drainage or any signs of infection present.  No open lesions or pre-ulcerative lesions.  No pain with calf compression, swelling, warmth, erythema  Assessment: Healed wound left foot with symptomatic onychomycosis, pre-ulcerative calluses  Plan: -All treatment options discussed with the patient including all alternatives, risks, complications.  -I debrided the hyperkeratotic lesion of the left foot submetatarsal 1 overlying the area of the wound which appears to be healed.  Continue offloading at all times.  Monitor for any recurrence.  Finish course of  antibiotics but the area appears to be healed and there is no signs of infection. -Sharply debrided the nails x6 without any complications or bleeding -Hyperkeratotic lesions were sharply debrided x2 without any complications or bleeding -Patient encouraged to call the office with any questions, concerns, change in symptoms.   Return in about 9 weeks (around 08/12/2018).  Trula Slade DPM

## 2018-08-12 ENCOUNTER — Ambulatory Visit: Payer: Medicare Other | Admitting: Podiatry

## 2018-10-28 ENCOUNTER — Emergency Department (HOSPITAL_COMMUNITY)
Admission: EM | Admit: 2018-10-28 | Discharge: 2018-10-28 | Disposition: A | Discharge status: Home / Self Care | Payer: Medicare Other | Attending: Emergency Medicine | Admitting: Emergency Medicine

## 2018-10-28 ENCOUNTER — Other Ambulatory Visit: Payer: Self-pay

## 2018-10-28 ENCOUNTER — Emergency Department (HOSPITAL_COMMUNITY): Payer: Medicare Other

## 2018-10-28 DIAGNOSIS — Z794 Long term (current) use of insulin: Secondary | ICD-10-CM | POA: Diagnosis not present

## 2018-10-28 DIAGNOSIS — Z7982 Long term (current) use of aspirin: Secondary | ICD-10-CM | POA: Diagnosis not present

## 2018-10-28 DIAGNOSIS — M5441 Lumbago with sciatica, right side: Secondary | ICD-10-CM | POA: Insufficient documentation

## 2018-10-28 DIAGNOSIS — E119 Type 2 diabetes mellitus without complications: Secondary | ICD-10-CM | POA: Insufficient documentation

## 2018-10-28 DIAGNOSIS — F17210 Nicotine dependence, cigarettes, uncomplicated: Secondary | ICD-10-CM | POA: Insufficient documentation

## 2018-10-28 DIAGNOSIS — Z91040 Latex allergy status: Secondary | ICD-10-CM | POA: Diagnosis not present

## 2018-10-28 DIAGNOSIS — Z79899 Other long term (current) drug therapy: Secondary | ICD-10-CM | POA: Diagnosis not present

## 2018-10-28 DIAGNOSIS — I10 Essential (primary) hypertension: Secondary | ICD-10-CM | POA: Insufficient documentation

## 2018-10-28 DIAGNOSIS — M545 Low back pain: Secondary | ICD-10-CM | POA: Diagnosis present

## 2018-10-28 MED ORDER — HYDROMORPHONE HCL 1 MG/ML IJ SOLN: 1.0000 mg | Freq: Once | INTRAMUSCULAR | Status: DC

## 2018-10-28 MED ORDER — HYDROCODONE-ACETAMINOPHEN 5-325 MG PO TABS: 1.0000 | ORAL_TABLET | Freq: Four times a day (QID) | ORAL | 0 refills | Status: AC | PRN

## 2018-10-28 MED ORDER — HYDROMORPHONE HCL 1 MG/ML IJ SOLN
1.0000 mg | Freq: Once | INTRAMUSCULAR | Status: AC
  Administered 2018-10-28: 1 mg via INTRAMUSCULAR
  Filled 2018-10-28: qty 1

## 2018-10-28 MED ORDER — LIDOCAINE 5 % EX PTCH: 1.0000 | MEDICATED_PATCH | CUTANEOUS | 0 refills | Status: DC

## 2018-10-28 NOTE — Discharge Instructions (Addendum)
Your evaluated today for back pain.  This is likely sciatica.  I have prescribed you pain medicine as well as naproxen and lidocaine patches.  Please take as prescribed.  I have also referred you to orthopedics.  If you continue to have pain please follow-up with them.  Return to the ED for any new or worsening symptoms.
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
## 2018-10-28 NOTE — ED Triage Notes (Signed)
Pt. Stated, I started having right lower back pain that starts in my rt. Side of my butt and goes down my rt. Leg.. This started today.

## 2018-10-28 NOTE — ED Notes (Signed)
Patient verbalizes understanding of discharge instructions. Opportunity for questioning and answers were provided. Armband removed by staff, pt discharged from ED.  

## 2018-11-01 ENCOUNTER — Other Ambulatory Visit: Payer: Self-pay | Admitting: Physician Assistant

## 2018-11-01 DIAGNOSIS — IMO0001 Reserved for inherently not codable concepts without codable children: Secondary | ICD-10-CM

## 2018-11-01 DIAGNOSIS — R209 Unspecified disturbances of skin sensation: Principal | ICD-10-CM

## 2018-11-07 ENCOUNTER — Other Ambulatory Visit: Payer: Self-pay

## 2018-11-07 ENCOUNTER — Ambulatory Visit
Admission: RE | Admit: 2018-11-07 | Discharge: 2018-11-07 | Disposition: A | Discharge status: Home / Self Care | Payer: Medicare Other | Source: Ambulatory Visit | Attending: Physician Assistant | Admitting: Physician Assistant

## 2018-11-07 DIAGNOSIS — IMO0001 Reserved for inherently not codable concepts without codable children: Secondary | ICD-10-CM

## 2018-11-07 DIAGNOSIS — R209 Unspecified disturbances of skin sensation: Principal | ICD-10-CM

## 2019-01-09 ENCOUNTER — Emergency Department (HOSPITAL_COMMUNITY): Payer: Medicare HMO

## 2019-01-09 ENCOUNTER — Emergency Department (HOSPITAL_COMMUNITY)
Admission: EM | Admit: 2019-01-09 | Discharge: 2019-01-10 | Disposition: A | Discharge status: Home / Self Care | Payer: Medicare HMO | Attending: Emergency Medicine | Admitting: Emergency Medicine

## 2019-01-09 ENCOUNTER — Encounter (HOSPITAL_COMMUNITY): Payer: Self-pay | Admitting: Emergency Medicine

## 2019-01-09 ENCOUNTER — Other Ambulatory Visit: Payer: Self-pay

## 2019-01-09 DIAGNOSIS — Y999 Unspecified external cause status: Secondary | ICD-10-CM | POA: Insufficient documentation

## 2019-01-09 DIAGNOSIS — E119 Type 2 diabetes mellitus without complications: Secondary | ICD-10-CM | POA: Diagnosis not present

## 2019-01-09 DIAGNOSIS — S60511A Abrasion of right hand, initial encounter: Secondary | ICD-10-CM | POA: Insufficient documentation

## 2019-01-09 DIAGNOSIS — S80211A Abrasion, right knee, initial encounter: Secondary | ICD-10-CM | POA: Insufficient documentation

## 2019-01-09 DIAGNOSIS — Z794 Long term (current) use of insulin: Secondary | ICD-10-CM | POA: Insufficient documentation

## 2019-01-09 DIAGNOSIS — F17210 Nicotine dependence, cigarettes, uncomplicated: Secondary | ICD-10-CM | POA: Insufficient documentation

## 2019-01-09 DIAGNOSIS — T07XXXA Unspecified multiple injuries, initial encounter: Secondary | ICD-10-CM

## 2019-01-09 DIAGNOSIS — I10 Essential (primary) hypertension: Secondary | ICD-10-CM | POA: Diagnosis not present

## 2019-01-09 DIAGNOSIS — Y929 Unspecified place or not applicable: Secondary | ICD-10-CM | POA: Insufficient documentation

## 2019-01-09 DIAGNOSIS — Z79899 Other long term (current) drug therapy: Secondary | ICD-10-CM | POA: Insufficient documentation

## 2019-01-09 DIAGNOSIS — S022XXA Fracture of nasal bones, initial encounter for closed fracture: Secondary | ICD-10-CM

## 2019-01-09 DIAGNOSIS — H1133 Conjunctival hemorrhage, bilateral: Secondary | ICD-10-CM | POA: Insufficient documentation

## 2019-01-09 DIAGNOSIS — Z7982 Long term (current) use of aspirin: Secondary | ICD-10-CM | POA: Insufficient documentation

## 2019-01-09 DIAGNOSIS — S80212A Abrasion, left knee, initial encounter: Secondary | ICD-10-CM | POA: Insufficient documentation

## 2019-01-09 DIAGNOSIS — Y939 Activity, unspecified: Secondary | ICD-10-CM | POA: Diagnosis not present
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
## 2019-01-10 ENCOUNTER — Emergency Department (HOSPITAL_COMMUNITY): Payer: Medicare HMO

## 2019-01-10 MED ORDER — HYDROCODONE-ACETAMINOPHEN 5-325 MG PO TABS
2.0000 | ORAL_TABLET | Freq: Once | ORAL | Status: AC
  Administered 2019-01-10: 2 via ORAL
  Filled 2019-01-10: qty 2

## 2019-01-10 MED ORDER — BACITRACIN ZINC 500 UNIT/GM EX OINT
1.0000 "application " | TOPICAL_OINTMENT | Freq: Two times a day (BID) | CUTANEOUS | Status: DC
  Administered 2019-01-10: 1 via TOPICAL
  Filled 2019-01-10: qty 2.7
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
## 2019-01-14 ENCOUNTER — Other Ambulatory Visit: Payer: Self-pay

## 2019-01-14 ENCOUNTER — Ambulatory Visit (INDEPENDENT_AMBULATORY_CARE_PROVIDER_SITE_OTHER): Payer: Medicare HMO | Admitting: Internal Medicine

## 2019-01-14 ENCOUNTER — Encounter: Payer: Self-pay | Admitting: Internal Medicine

## 2019-01-14 DIAGNOSIS — E1122 Type 2 diabetes mellitus with diabetic chronic kidney disease: Secondary | ICD-10-CM

## 2019-01-14 DIAGNOSIS — N184 Chronic kidney disease, stage 4 (severe): Secondary | ICD-10-CM

## 2019-01-14 DIAGNOSIS — E1165 Type 2 diabetes mellitus with hyperglycemia: Secondary | ICD-10-CM

## 2019-01-14 DIAGNOSIS — Z794 Long term (current) use of insulin: Secondary | ICD-10-CM | POA: Insufficient documentation

## 2019-01-14 DIAGNOSIS — E1142 Type 2 diabetes mellitus with diabetic polyneuropathy: Secondary | ICD-10-CM | POA: Insufficient documentation
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
## 2019-02-03 ENCOUNTER — Other Ambulatory Visit: Payer: Self-pay | Admitting: Internal Medicine

## 2019-02-03 DIAGNOSIS — R319 Hematuria, unspecified: Secondary | ICD-10-CM

## 2019-02-25 ENCOUNTER — Ambulatory Visit
Admission: RE | Admit: 2019-02-25 | Discharge: 2019-02-25 | Disposition: A | Discharge status: Home / Self Care | Payer: Medicare HMO | Source: Ambulatory Visit | Attending: Internal Medicine | Admitting: Internal Medicine

## 2019-02-25 DIAGNOSIS — R319 Hematuria, unspecified: Secondary | ICD-10-CM

## 2019-03-11 ENCOUNTER — Ambulatory Visit: Payer: Medicare HMO | Admitting: Internal Medicine
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
## 2019-04-14 ENCOUNTER — Other Ambulatory Visit: Payer: Self-pay

## 2019-04-14 ENCOUNTER — Ambulatory Visit (INDEPENDENT_AMBULATORY_CARE_PROVIDER_SITE_OTHER): Payer: Medicare HMO | Admitting: Podiatry

## 2019-04-14 ENCOUNTER — Encounter: Payer: Self-pay | Admitting: Podiatry

## 2019-04-14 VITALS — Temp 97.3°F

## 2019-04-14 DIAGNOSIS — M79674 Pain in right toe(s): Secondary | ICD-10-CM

## 2019-04-14 DIAGNOSIS — E1142 Type 2 diabetes mellitus with diabetic polyneuropathy: Secondary | ICD-10-CM

## 2019-04-14 DIAGNOSIS — B351 Tinea unguium: Secondary | ICD-10-CM

## 2019-04-14 DIAGNOSIS — Z89422 Acquired absence of other left toe(s): Secondary | ICD-10-CM

## 2019-04-14 DIAGNOSIS — M79675 Pain in left toe(s): Secondary | ICD-10-CM | POA: Diagnosis not present

## 2019-04-14 DIAGNOSIS — L84 Corns and callosities: Secondary | ICD-10-CM

## 2019-04-14 NOTE — Patient Instructions (Signed)
Diabetes Mellitus and Foot Care Foot care is an important part of your health, especially when you have diabetes. Diabetes may cause you to have problems because of poor blood flow (circulation) to your feet and legs, which can cause your skin to:  Become thinner and drier.  Break more easily.  Heal more slowly.  Peel and crack. You may also have nerve damage (neuropathy) in your legs and feet, causing decreased feeling in them. This means that you may not notice minor injuries to your feet that could lead to more serious problems. Noticing and addressing any potential problems early is the best way to prevent future foot problems. How to care for your feet Foot hygiene  Wash your feet daily with warm water and mild soap. Do not use hot water. Then, pat your feet and the areas between your toes until they are completely dry. Do not soak your feet as this can dry your skin.  Trim your toenails straight across. Do not dig under them or around the cuticle. File the edges of your nails with an emery board or nail file.  Apply a moisturizing lotion or petroleum jelly to the skin on your feet and to dry, brittle toenails. Use lotion that does not contain alcohol and is unscented. Do not apply lotion between your toes. Shoes and socks  Wear clean socks or stockings every day. Make sure they are not too tight. Do not wear knee-high stockings since they may decrease blood flow to your legs.  Wear shoes that fit properly and have enough cushioning. Always look in your shoes before you put them on to be sure there are no objects inside.  To break in new shoes, wear them for just a few hours a day. This prevents injuries on your feet. Wounds, scrapes, corns, and calluses  Check your feet daily for blisters, cuts, bruises, sores, and redness. If you cannot see the bottom of your feet, use a mirror or ask someone for help.  Do not cut corns or calluses or try to remove them with medicine.  If you  find a minor scrape, cut, or break in the skin on your feet, keep it and the skin around it clean and dry. You may clean these areas with mild soap and water. Do not clean the area with peroxide, alcohol, or iodine.  If you have a wound, scrape, corn, or callus on your foot, look at it several times a day to make sure it is healing and not infected. Check for: ? Redness, swelling, or pain. ? Fluid or blood. ? Warmth. ? Pus or a bad smell. General instructions  Do not cross your legs. This may decrease blood flow to your feet.  Do not use heating pads or hot water bottles on your feet. They may burn your skin. If you have lost feeling in your feet or legs, you may not know this is happening until it is too late.  Protect your feet from hot and cold by wearing shoes, such as at the beach or on hot pavement.  Schedule a complete foot exam at least once a year (annually) or more often if you have foot problems. If you have foot problems, report any cuts, sores, or bruises to your health care provider immediately. Contact a health care provider if:  You have a medical condition that increases your risk of infection and you have any cuts, sores, or bruises on your feet.  You have an injury that is not   healing.  You have redness on your legs or feet.  You feel burning or tingling in your legs or feet.  You have pain or cramps in your legs and feet.  Your legs or feet are numb.  Your feet always feel cold.  You have pain around a toenail. Get help right away if:  You have a wound, scrape, corn, or callus on your foot and: ? You have pain, swelling, or redness that gets worse. ? You have fluid or blood coming from the wound, scrape, corn, or callus. ? Your wound, scrape, corn, or callus feels warm to the touch. ? You have pus or a bad smell coming from the wound, scrape, corn, or callus. ? You have a fever. ? You have a red line going up your leg. Summary  Check your feet every day  for cuts, sores, red spots, swelling, and blisters.  Moisturize feet and legs daily.  Wear shoes that fit properly and have enough cushioning.  If you have foot problems, report any cuts, sores, or bruises to your health care provider immediately.  Schedule a complete foot exam at least once a year (annually) or more often if you have foot problems. This information is not intended to replace advice given to you by your health care provider. Make sure you discuss any questions you have with your health care provider. Document Released: 08/04/2000 Document Revised: 09/19/2017 Document Reviewed: 09/08/2016 Elsevier Patient Education  2020 [employer]   Onychomycosis/Fungal Toenails  WHAT IS IT? An infection that lies within the keratin of your nail plate that is caused by a fungus.  WHY ME? Fungal infections affect all ages, sexes, races, and creeds.  There may be many factors that predispose you to a fungal infection such as age, coexisting medical conditions such as diabetes, or an autoimmune disease; stress, medications, fatigue, genetics, etc.  Bottom line: fungus thrives in a warm, moist environment and your shoes offer such a location.  IS IT CONTAGIOUS? Theoretically, yes.  You do not want to share shoes, nail clippers or files with someone who has fungal toenails.  Walking around barefoot in the same room or sleeping in the same bed is unlikely to transfer the organism.  It is important to realize, however, that fungus can spread easily from one nail to the next on the same foot.  HOW DO WE TREAT THIS?  There are several ways to treat this condition.  Treatment may depend on many factors such as age, medications, pregnancy, liver and kidney conditions, etc.  It is best to ask your doctor which options are available to you.  1. No treatment.   Unlike many other medical concerns, you can live with this condition.  However for many people this can be a painful condition and may lead to  ingrown toenails or a bacterial infection.  It is recommended that you keep the nails cut short to help reduce the amount of fungal nail. 2. Topical treatment.  These range from herbal remedies to prescription strength nail lacquers.  About 40-50% effective, topicals require twice daily application for approximately 9 to 12 months or until an entirely new nail has grown out.  The most effective topicals are medical grade medications available through physicians offices. 3. Oral antifungal medications.  With an 80-90% cure rate, the most common oral medication requires 3 to 4 months of therapy and stays in your system for a year as the new nail grows out.  Oral antifungal medications do require   blood work to make sure it is a safe drug for you.  A liver function panel will be performed prior to starting the medication and after the first month of treatment.  It is important to have the blood work performed to avoid any harmful side effects.  In general, this medication safe but blood work is required. 4. Laser Therapy.  This treatment is performed by applying a specialized laser to the affected nail plate.  This therapy is noninvasive, fast, and non-painful.  It is not covered by insurance and is therefore, out of pocket.  The results have been very good with a 80-95% cure rate.  [facility] is the only practice in the area to offer this therapy. 5. Permanent Nail Avulsion.  Removing the entire nail so that a new nail will not grow back. 

## 2019-04-23 NOTE — Progress Notes (Signed)
Subjective: Anthony Flowers is a 58 y.o. y.o. male who presents today with h/o diabetes and cc of painful, discolored, thick toenails and painful callus/corn which interfere with daily activities. Pain is aggravated when wearing enclosed shoe gear and relieved with periodic professional debridement.  He has h/o amputations of left 4, 5 and right 2, 5.   Current Outpatient Medications:  .  amLODipine (NORVASC) 10 MG tablet, Take 10 mg by mouth daily. , Disp: , Rfl: 1 .  aspirin EC 81 MG tablet, Take 81 mg by mouth daily., Disp: , Rfl:  .  atorvastatin (LIPITOR) 10 MG tablet, , Disp: , Rfl:  .  atorvastatin (LIPITOR) 20 MG tablet, Take 1 tablet (20 mg total) by mouth daily., Disp: 90 tablet, Rfl: 1 .  B-D ULTRAFINE III SHORT PEN 31G X 8 MM MISC, 4 (four) times daily. for testing, Disp: , Rfl: 1 .  carvedilol (COREG) 6.25 MG tablet, Take 6.25 mg by mouth 2 (two) times daily., Disp: , Rfl:  .  CHANTIX CONTINUING MONTH PAK 1 MG tablet, , Disp: , Rfl:  .  glucose blood (ONETOUCH VERIO) test strip, 1 each by Other route 3 (three) times daily. And lancets 3/day, Disp: 100 each, Rfl: 11 .  hydrALAZINE (APRESOLINE) 10 MG tablet, Take 10 mg by mouth daily., Disp: , Rfl:  .  hydrochlorothiazide (HYDRODIURIL) 25 MG tablet, , Disp: , Rfl:  .  HYDROcodone-acetaminophen (NORCO) 10-325 MG tablet, , Disp: , Rfl:  .  insulin aspart (NOVOLOG FLEXPEN) 100 UNIT/ML FlexPen, Inject 25 Units into the skin 3 (three) times daily with meals. And pen needles 4/day, Disp: 30 mL, Rfl: 11 .  LANTUS SOLOSTAR 100 UNIT/ML Solostar Pen, Inject 20 Units into the skin at bedtime. (Patient taking differently: Inject 52 Units into the skin at bedtime. ), Disp: 15 mL, Rfl: 1 .  lidocaine (LIDODERM) 5 %, Place 1 patch onto the skin daily. Remove & Discard patch within 12 hours or as directed by MD, Disp: 30 patch, Rfl: 0 .  mupirocin ointment (BACTROBAN) 2 %, Apply 1 application topically 2 (two) times daily. (Patient not taking:  Reported on 10/28/2018), Disp: 30 g, Rfl: 2 .  omeprazole (PRILOSEC) 40 MG capsule, Take 40 mg by mouth daily., Disp: , Rfl:  .  Semaglutide (OZEMPIC, 1 MG/DOSE, [place]), Inject 1 mg into the skin once a week., Disp: , Rfl:  .  valsartan (DIOVAN) 160 MG tablet, Take 160 mg by mouth daily., Disp: , Rfl:   Allergies  Allergen Reactions  . Latex Hives  . Tape Hives  . Chlorhexidine Itching and Rash    Develops skin irritation   . Fish Allergy Hives and Rash    Patient unsure which fish  . Penicillins Itching, Swelling and Rash    Did it involve swelling of the face/tongue/throat, SOB, or low BP? Yes Did it involve sudden or severe rash/hives, skin peeling, or any reaction on the inside of your mouth or nose? No Did you need to seek medical attention at a hospital or doctor's office? Yes When did it last happen?Couple of years If all above answers are "NO", may proceed with cephalosporin use.     Objective: Vitals:   04/14/19 1640  Temp: (!) 97.3 F (36.3 C)    Vascular Examination: Capillary refill time <3 seconds x 6 digits.  Dorsalis pedis pulses palpable b/l.  Posterior tibial pulses palpable b/l..  Digital hair absent x 6 digits.  Skin temperature gradient WNL b/l.  Dermatological  Examination: Skin with normal turgor, texture and tone b/l.  Toenails 1, 2, 3 left, 1, 3, 4 right discolored, thick, dystrophic with subungual debris and pain with palpation to nailbeds due to thickness of nails.  Hyperkeratotic lesions submet head 5 b/l, submet head 1 left, submet head 4 left, dorsal 4th PIPJ, and distal tip left 2nd digit. No erythema, no edema, no drainage, no flocculence noted.    Musculoskeletal: Muscle strength 5/5 to all LE muscle groups b/l.  Amputations digits left 4th, 5th and right 2, 5.  Neurological: Sensation decreased with 10 gram monofilament.  Assessment: 1. Painful onychomycosis toenails 1, 2, 3 left, 1, 3, 4 right 2.  Calluses submet head 5 b/l,  submet head 1 left, submet head 4 left 3.  Corns dorsal 4th PIPJ, and distal tip left 2nd digit. 4.  NIDDM with neuropathy 5. 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 Continue diabetic foot care principles. Literature dispensed on today. 2. Toenails 1, 2, 3 left, 1, 3, 4 right were debrided in length and girth without iatrogenic bleeding. 3. Hyperkeratotic lesion(s) submet head 5 b/l, submet head 1 left, submet head 4 left, dorsal 4th PIPJ, and distal tip left 2nd digit  pared with sterile scalpel blade without incident. 4. Patient to continue soft, supportive shoe gear daily. 5. Patient to report any pedal injuries to medical professional immediately. 6. Follow up 3 months. 7. Patient/POA to call should there be a concern in the interim.

## 2019-05-01 ENCOUNTER — Telehealth: Payer: Self-pay | Admitting: Internal Medicine

## 2019-05-01 NOTE — Telephone Encounter (Signed)
Dr Julian Hy from [facility] is requesting visit notes from patients 12/2018 visit.  Fax number : [phone]

## 2019-05-02 NOTE — Telephone Encounter (Signed)
Faxed and conformation received

## 2019-05-27 ENCOUNTER — Telehealth: Payer: Self-pay | Admitting: Internal Medicine

## 2019-05-27 NOTE — Telephone Encounter (Signed)
Referring Physician called to have [place] sent them the visit notes from the new patient and follow up visit with this patient.  Was seen on 01/14/2019 and 03/11/2019.    Please send by fax to [phone]

## 2019-05-27 NOTE — Telephone Encounter (Signed)
faxed

## 2019-07-14 ENCOUNTER — Ambulatory Visit: Payer: Medicare HMO | Admitting: Podiatry

## 2019-07-28 ENCOUNTER — Other Ambulatory Visit: Payer: Self-pay

## 2019-07-28 ENCOUNTER — Ambulatory Visit (INDEPENDENT_AMBULATORY_CARE_PROVIDER_SITE_OTHER): Payer: Medicare HMO | Admitting: Podiatry

## 2019-07-28 DIAGNOSIS — T148XXA Other injury of unspecified body region, initial encounter: Secondary | ICD-10-CM

## 2019-07-28 DIAGNOSIS — E1142 Type 2 diabetes mellitus with diabetic polyneuropathy: Secondary | ICD-10-CM

## 2019-07-28 DIAGNOSIS — L84 Corns and callosities: Secondary | ICD-10-CM

## 2019-07-28 DIAGNOSIS — Z89422 Acquired absence of other left toe(s): Secondary | ICD-10-CM | POA: Diagnosis not present

## 2019-07-28 MED ORDER — DOXYCYCLINE HYCLATE 100 MG PO TABS: 100.0000 mg | ORAL_TABLET | Freq: Two times a day (BID) | ORAL | 0 refills | Status: DC

## 2019-07-28 NOTE — Progress Notes (Signed)
Note: being seen by PA-C; advised to get virtual visit w/ her managing MD.  Martin Majestic ahead and cast him for DBS; he will notify when appointment made.

## 2019-08-03 NOTE — Progress Notes (Signed)
Subjective: 58 year old male presents the office today for concerns of blisters to both of his feet.  There is a blister on the outside aspect of his right foot as well on the bottom of his left foot in the ball of the foot.  His wife checks his feet daily.  Denies any drainage and denies any swelling or redness.  He is having no pain. Denies any systemic complaints such as fevers, chills, nausea, vomiting. No acute changes since last appointment, and no other complaints at this time.   Objective: AAO x3, NAD DP/PT pulses palpable bilaterally, CRT less than 3 seconds On the plantar aspect of the left foot there is no significant blister however there is prominence the metatarsal heads plantarly and there is hyperkeratotic tissue with evidence of dried blood present.  There is a blister on the lateral aspect of the right foot and upon puncturing this is clear fluid present there is no purulence.  There is no surrounding erythema there is no ulceration or crepitation otherwise. No open lesions or pre-ulcerative lesions.  No pain with calf compression, swelling, warmth, erythema  Assessment: Blister right foot with preulcerative area left metatarsal area  Plan: -All treatment options discussed with the patient including all alternatives, risks, complications.  -Debrided some of the hyperkeratotic tissue on the left foot although minimal today.  For now dispensed metatarsal offloading pads.  I cleaned the blister with alcohol on the right foot and made a small puncture to the area to drain this to ensure that there is no infection.  There is no purulence.  Recommended small amount of Betadine to the area daily.  Given his history prescribe doxycycline as a precaution. -We really need to work on offloading of the feet.  Liliane Channel saw him today for measurement of diabetic inserts. -Daily foot inspection discussed -Monitor for any clinical signs or symptoms of infection and directed to call the office  immediately should any occur or go to the ER. -Patient encouraged to call the office with any questions, concerns, change in symptoms.   Return in about 2 weeks (around 08/11/2019).  Trula Slade DPM

## 2019-08-11 ENCOUNTER — Other Ambulatory Visit: Payer: Self-pay

## 2019-08-11 ENCOUNTER — Ambulatory Visit (INDEPENDENT_AMBULATORY_CARE_PROVIDER_SITE_OTHER): Payer: Medicare HMO | Admitting: Podiatry

## 2019-08-11 DIAGNOSIS — M79674 Pain in right toe(s): Secondary | ICD-10-CM

## 2019-08-11 DIAGNOSIS — L84 Corns and callosities: Secondary | ICD-10-CM

## 2019-08-11 DIAGNOSIS — T148XXA Other injury of unspecified body region, initial encounter: Secondary | ICD-10-CM

## 2019-08-11 DIAGNOSIS — Z89422 Acquired absence of other left toe(s): Secondary | ICD-10-CM

## 2019-08-11 DIAGNOSIS — E1142 Type 2 diabetes mellitus with diabetic polyneuropathy: Secondary | ICD-10-CM

## 2019-08-11 DIAGNOSIS — B351 Tinea unguium: Secondary | ICD-10-CM

## 2019-08-11 DIAGNOSIS — M79675 Pain in left toe(s): Secondary | ICD-10-CM

## 2019-08-17 NOTE — Progress Notes (Signed)
Subjective: 58 year old male presents the office today with his wife for follow-up evaluation of a blisterTo his right foot as well as preulcerative area left metatarsal area.  Overall the color is much improvedTo the right foot as well as for preulcerative callus on the left foot.  Overall the color is noted but is much improved and denies any increase in swelling or redness.  No open sores and denies any new blister formations.  Also asking for his nails be trimmed today. Denies any systemic complaints such as fevers, chills, nausea, vomiting. No acute changes since last appointment, and no other complaints at this time.   Objective: AAO x3, NAD DP/PT pulses palpable bilaterally, CRT less than 3 seconds The blister on the right foot is resolved.  Hyperkeratotic tissue left foot submetatarsal area which is improved.  Upon debridement there is no underlying ulceration, drainage or any signs of infection. There is no edema, erythema to the feet bilaterally. Nails are hypertrophic trophic, dystrophic with yellow-brown discoloration to the nails 1 through 3 on the left and 1 through 4 on the right.  No redness or drainage or any signs of infection of the toenail sites. No open lesions or pre-ulcerative lesions.  No pain with calf compression, swelling, warmth, erythema  Assessment: 58 year old male with preulcerative calluses left foot, resolved blister right foot with symptomatic onychomycosis  Plan: -All treatment options discussed with the patient including all alternatives, risks, complications.  -Debrided hyperkeratotic tissue left foot sinus tarsi.  Any complications or bleeding.  Continue offloading pads.  Blister on the right foot has resolved continue to monitor for any new blisters or skin breakdown.  I debrided the nails x7 without any complications or bleeding.  Discussed the point of daily foot inspection. -Patient encouraged to call the office with any questions, concerns, change in  symptoms.   Return in about 4 weeks (around 09/08/2019).  Trula Slade DPM

## 2019-09-08 ENCOUNTER — Ambulatory Visit (INDEPENDENT_AMBULATORY_CARE_PROVIDER_SITE_OTHER): Payer: Medicare HMO | Admitting: Podiatry

## 2019-09-08 ENCOUNTER — Other Ambulatory Visit: Payer: Self-pay

## 2019-09-08 DIAGNOSIS — L84 Corns and callosities: Secondary | ICD-10-CM

## 2019-09-08 DIAGNOSIS — Z89422 Acquired absence of other left toe(s): Secondary | ICD-10-CM | POA: Diagnosis not present

## 2019-09-08 DIAGNOSIS — T148XXA Other injury of unspecified body region, initial encounter: Secondary | ICD-10-CM

## 2019-09-08 DIAGNOSIS — E1142 Type 2 diabetes mellitus with diabetic polyneuropathy: Secondary | ICD-10-CM

## 2019-09-09 NOTE — Progress Notes (Signed)
Subjective: 59 year old male presents the office today for follow-up evaluation of blisters to his feet.  He states they have resolved and how he has calluses.  Note that the calluses trimmed he is also still awaiting diabetic shoes.  He denies any open sores.  Denies any swelling or redness.  He denies any fevers, chills, nausea, vomiting.  No calf pain, chest pain, shortness of breath.  Objective: AAO x3, NAD DP/PT pulses palpable bilaterally, CRT less than 3 seconds The blister on the right foot is resolved however there are now hyperkeratotic lesion submetatarsal area as well as hyperkeratotic lesion on the right foot.  Upon debridement there is no underlying ulceration identified in the areas today from the left side they are preulcerative.  Bilateral digital amputations bilaterally. No open lesions or pre-ulcerative lesions.  Prominence the metatarsal heads plantarly. No pain with calf compression, swelling, warmth, erythema  Assessment: 59 year old male with preulcerative calluses bilaterally  Plan: -All treatment options discussed with the patient including all alternatives, risks, complications.  -Debrided hyperkeratotic lesions or any complications or bleeding.  Continue offloading pads.  We are awaiting diabetic shoes with inserts.  I do feel this will be helpful for him given his history of amputation as well as history of blister, callus formation.  Return in about 4 weeks (around 10/06/2019).  Trula Slade DPM

## 2019-10-06 ENCOUNTER — Encounter: Payer: Self-pay | Admitting: Podiatry

## 2019-10-06 ENCOUNTER — Other Ambulatory Visit: Payer: Self-pay

## 2019-10-06 ENCOUNTER — Ambulatory Visit (INDEPENDENT_AMBULATORY_CARE_PROVIDER_SITE_OTHER): Payer: Medicare HMO | Admitting: Podiatry

## 2019-10-06 DIAGNOSIS — E1142 Type 2 diabetes mellitus with diabetic polyneuropathy: Secondary | ICD-10-CM

## 2019-10-06 DIAGNOSIS — L84 Corns and callosities: Secondary | ICD-10-CM

## 2019-10-06 DIAGNOSIS — B351 Tinea unguium: Secondary | ICD-10-CM | POA: Diagnosis not present

## 2019-10-06 DIAGNOSIS — Z89422 Acquired absence of other left toe(s): Secondary | ICD-10-CM

## 2019-10-06 NOTE — Progress Notes (Signed)
Subjective: 59 year old male presents the office today for follow-up of preulcerative calluses to his feet.  Overall he is doing better.  He is still wearing regular shoes and we are trying to get him diabetic shoes however due to insurance regulations we have been not able to do so thus far.  Denies any open sores currently.  He denies any fevers, chills, nausea, vomiting.  No calf pain, chest pain, shortness of breath.  Objective: AAO x3, NAD DP/PT pulses palpable bilaterally, CRT less than 3 seconds Hyperkeratotic tissue submetatarsal area bilaterally left side worse than right.  Currently there is no underlying ulceration identified but on the left foot adjacent to the third MPJ is a preulcerative area but there is no skin breakdown identified today.  Hammertoes are present with hallux malleus present.  Nails are hypertrophic, dystrophic with yellow-brown discoloration. Fourth and fifth ray amputations bilateral. No open lesions or pre-ulcerative lesions.  Prominence the metatarsal heads plantarly. No pain with calf compression, swelling, warmth, erythema  Assessment: 59 year old male with preulcerative calluses bilaterally; mycosis  Plan: -All treatment options discussed with the patient including all alternatives, risks, complications.  -Hyperkeratotic lesions without any complications or bleeding.  There is a preulcerative area in the left foot but no skin breakdown identified today.  Monitor closely. -Debrided the nails x6 without any complications or bleeding. -Awaiting diabetic shoes -Discussed the importance of daily foot inspection.  Return in about 4 weeks (around 11/03/2019) for calluses.  Trula Slade DPM

## 2019-10-20 DIAGNOSIS — L97509 Non-pressure chronic ulcer of other part of unspecified foot with unspecified severity: Secondary | ICD-10-CM

## 2019-10-20 DIAGNOSIS — E11621 Type 2 diabetes mellitus with foot ulcer: Secondary | ICD-10-CM

## 2019-10-20 HISTORY — DX: Non-pressure chronic ulcer of other part of unspecified foot with unspecified severity: L97.509

## 2019-10-20 HISTORY — DX: Type 2 diabetes mellitus with foot ulcer: E11.621

## 2019-10-28 ENCOUNTER — Encounter: Payer: Self-pay | Admitting: Podiatry

## 2019-10-28 ENCOUNTER — Ambulatory Visit (INDEPENDENT_AMBULATORY_CARE_PROVIDER_SITE_OTHER): Payer: Medicare HMO | Admitting: Podiatry

## 2019-10-28 ENCOUNTER — Other Ambulatory Visit: Payer: Self-pay

## 2019-10-28 ENCOUNTER — Ambulatory Visit (INDEPENDENT_AMBULATORY_CARE_PROVIDER_SITE_OTHER): Payer: Medicare HMO

## 2019-10-28 VITALS — Temp 98.4°F

## 2019-10-28 DIAGNOSIS — L97521 Non-pressure chronic ulcer of other part of left foot limited to breakdown of skin: Secondary | ICD-10-CM

## 2019-10-28 DIAGNOSIS — M216X2 Other acquired deformities of left foot: Secondary | ICD-10-CM | POA: Diagnosis not present

## 2019-10-28 DIAGNOSIS — L97522 Non-pressure chronic ulcer of other part of left foot with fat layer exposed: Secondary | ICD-10-CM | POA: Diagnosis not present

## 2019-10-28 MED ORDER — DOXYCYCLINE HYCLATE 100 MG PO TABS: 100.0000 mg | ORAL_TABLET | Freq: Two times a day (BID) | ORAL | 0 refills | Status: DC

## 2019-10-28 NOTE — Progress Notes (Signed)
Subjective: 59 year old male presents the office for concerns of a new ulceration the left foot.  They first noticed ulcer on Friday however his wife states that she noticed an odor today.  She did try to trim a lot of the dry skin off herself and she noticed some dried blood.  Also she noticed the wound.  Only some bloody drainage but no pus.  No swelling or redness or warmth.  No pain.Denies any systemic complaints such as fevers, chills, nausea, vomiting. No acute changes since last appointment, and no other complaints at this time.   Objective: AAO x3, NAD Neurovascular status unchanged.  DP, PT pulses palpable 2/4.  On the left foot submetatarsal 3 areas granular ulceration with surrounding hyperkeratotic tissue.  The wound measures 1 x 1 cm and superficial without any probing, undermining or tunneling.  He does get pain to the area next pain level is 9/10.  Only bloody drainage there is no purulence identified there is no fluctuation or crepitation peer there is no significant edema there is no erythema or warmth.  Prominent metatarsal heads plantarly.  Preulcerative callus left submetatarsal 1. No pain with calf compression, swelling, warmth, erythema  Assessment: 59 year old male left foot ulceration  Plan: -All treatment options discussed with the patient including all alternatives, risks, complications.  -X-rays obtained reviewed.  No evidence of acute fracture, osteomyelitis or foreign body.  No soft tissue emphysema identified. -Sharply debrided the wound thousand 312 with scalpel any complications on healthy, granular tissue.  Recommend continue with medihoney dressing changes daily which was dispensed. -Doxycycline -Offloading shoe with a Pegassist dispensed. -Monitor for any clinical signs or symptoms of infection and directed to call the office immediately should any occur or go to the ER. -Patient encouraged to call the office with any questions, concerns, change in symptoms.    Return in about 1 week (around 11/04/2019).  Trula Slade DPM

## 2019-11-03 ENCOUNTER — Ambulatory Visit: Payer: Medicare HMO | Admitting: Podiatry

## 2019-11-04 ENCOUNTER — Other Ambulatory Visit: Payer: Self-pay

## 2019-11-04 ENCOUNTER — Encounter: Payer: Self-pay | Admitting: Podiatry

## 2019-11-04 ENCOUNTER — Ambulatory Visit (INDEPENDENT_AMBULATORY_CARE_PROVIDER_SITE_OTHER): Payer: Medicare HMO | Admitting: Podiatry

## 2019-11-04 VITALS — Temp 97.8°F

## 2019-11-04 DIAGNOSIS — E1142 Type 2 diabetes mellitus with diabetic polyneuropathy: Secondary | ICD-10-CM | POA: Diagnosis not present

## 2019-11-04 DIAGNOSIS — L97522 Non-pressure chronic ulcer of other part of left foot with fat layer exposed: Secondary | ICD-10-CM

## 2019-11-09 NOTE — Progress Notes (Signed)
Subjective: 59 year old male presents the office for follow-up evaluation of an ulceration of the left foot.  He states that overall he is doing well he denies any drainage or pus coming from the area.  No increase in swelling no redness.  Denies any fevers, chills, nausea, vomiting.  No calf pain, chest pain, shortness of breath.    Objective: AAO x3, NAD DP, PT pulses palpable 2/4.  On the left foot submetatarsal 3 areas granular ulceration with surrounding hyperkeratotic tissue.  The wound measures 1 x 0.8x0.1 cm and without any probing, undermining or tunneling.  Preulcerative area submetatarsal 1 area.  No other open lesions identified. No pain with calf compression, swelling, warmth, erythema  Assessment: 59 year old male left foot ulceration  Plan: -All treatment options discussed with the patient including all alternatives, risks, complications.  -Sharply debrided the wound utilizing a #312 with scalpel any complications on healthy, granular tissue.  We will continue with medihoney dressing changes daily.  Continue offloading at all times -Finish course of antibiotics -Offloading shoe with a Pegassist dispensed. -Monitor for any clinical signs or symptoms of infection and directed to call the office immediately should any occur or go to the ER. -Patient encouraged to call the office with any questions, concerns, change in symptoms.   Return in about 10 days (around 11/14/2019) for wound check .  Trula Slade DPM

## 2019-11-13 ENCOUNTER — Ambulatory Visit (INDEPENDENT_AMBULATORY_CARE_PROVIDER_SITE_OTHER): Payer: Medicare HMO

## 2019-11-13 ENCOUNTER — Telehealth: Payer: Self-pay | Admitting: *Deleted

## 2019-11-13 ENCOUNTER — Other Ambulatory Visit: Payer: Self-pay

## 2019-11-13 ENCOUNTER — Encounter: Payer: Self-pay | Admitting: Podiatry

## 2019-11-13 ENCOUNTER — Ambulatory Visit (INDEPENDENT_AMBULATORY_CARE_PROVIDER_SITE_OTHER): Payer: Medicare HMO | Admitting: Podiatry

## 2019-11-13 VITALS — Temp 96.2°F

## 2019-11-13 DIAGNOSIS — L24A9 Irritant contact dermatitis due friction or contact with other specified body fluids: Secondary | ICD-10-CM

## 2019-11-13 DIAGNOSIS — L97522 Non-pressure chronic ulcer of other part of left foot with fat layer exposed: Secondary | ICD-10-CM

## 2019-11-13 DIAGNOSIS — M8600 Acute hematogenous osteomyelitis, unspecified site: Secondary | ICD-10-CM | POA: Diagnosis not present

## 2019-11-13 DIAGNOSIS — T148XXA Other injury of unspecified body region, initial encounter: Secondary | ICD-10-CM

## 2019-11-13 MED ORDER — DOXYCYCLINE HYCLATE 100 MG PO TABS: 100.0000 mg | ORAL_TABLET | Freq: Two times a day (BID) | ORAL | 0 refills | Status: DC

## 2019-11-13 MED ORDER — SANTYL 250 UNIT/GM EX OINT: 1.0000 "application " | TOPICAL_OINTMENT | Freq: Every day | CUTANEOUS | 0 refills | Status: DC

## 2019-11-13 MED ORDER — HYDROCODONE-ACETAMINOPHEN 5-325 MG PO TABS: 1.0000 | ORAL_TABLET | Freq: Four times a day (QID) | ORAL | 0 refills | Status: DC | PRN

## 2019-11-13 MED ORDER — SANTYL 250 UNIT/GM EX OINT: 1.0000 "application " | TOPICAL_OINTMENT | Freq: Every day | CUTANEOUS | 0 refills | Status: AC

## 2019-11-13 NOTE — Telephone Encounter (Signed)
[facility] states pt received Norco 10/325mg  #90 tid on 10/24/2019 and Dr. Jacqualyn Posey ordered Norco 5/325mg  #10 one tablet qid. I told Thurmond Butts to hold our Norco rx until the 10/24/2019 was completed.

## 2019-11-13 NOTE — Telephone Encounter (Signed)
Yes, he cannot have the prescription filled that I sent. If he is out let me know

## 2019-11-13 NOTE — Telephone Encounter (Signed)
[facility] - [employer] does not come in 15grams only 30grams. I escribed 30grams Santyl.

## 2019-11-17 ENCOUNTER — Emergency Department (HOSPITAL_COMMUNITY): Payer: Medicare HMO

## 2019-11-17 ENCOUNTER — Inpatient Hospital Stay (HOSPITAL_COMMUNITY)
Admission: EM | Admit: 2019-11-17 | Discharge: 2019-11-21 | DRG: 854 | Disposition: A | Discharge status: Home Health | Payer: Medicare HMO | Attending: Internal Medicine | Admitting: Internal Medicine

## 2019-11-17 ENCOUNTER — Other Ambulatory Visit: Payer: Self-pay

## 2019-11-17 ENCOUNTER — Inpatient Hospital Stay (HOSPITAL_COMMUNITY): Payer: Medicare HMO

## 2019-11-17 ENCOUNTER — Encounter (HOSPITAL_COMMUNITY): Payer: Self-pay | Admitting: *Deleted

## 2019-11-17 DIAGNOSIS — Z79899 Other long term (current) drug therapy: Secondary | ICD-10-CM

## 2019-11-17 DIAGNOSIS — Z8249 Family history of ischemic heart disease and other diseases of the circulatory system: Secondary | ICD-10-CM

## 2019-11-17 DIAGNOSIS — Z7982 Long term (current) use of aspirin: Secondary | ICD-10-CM | POA: Diagnosis not present

## 2019-11-17 DIAGNOSIS — E11621 Type 2 diabetes mellitus with foot ulcer: Secondary | ICD-10-CM | POA: Diagnosis present

## 2019-11-17 DIAGNOSIS — F17210 Nicotine dependence, cigarettes, uncomplicated: Secondary | ICD-10-CM | POA: Diagnosis present

## 2019-11-17 DIAGNOSIS — E11649 Type 2 diabetes mellitus with hypoglycemia without coma: Secondary | ICD-10-CM | POA: Diagnosis present

## 2019-11-17 DIAGNOSIS — Z79891 Long term (current) use of opiate analgesic: Secondary | ICD-10-CM | POA: Diagnosis not present

## 2019-11-17 DIAGNOSIS — L03116 Cellulitis of left lower limb: Secondary | ICD-10-CM | POA: Diagnosis present

## 2019-11-17 DIAGNOSIS — M869 Osteomyelitis, unspecified: Secondary | ICD-10-CM | POA: Diagnosis present

## 2019-11-17 DIAGNOSIS — Z794 Long term (current) use of insulin: Secondary | ICD-10-CM

## 2019-11-17 DIAGNOSIS — L97429 Non-pressure chronic ulcer of left heel and midfoot with unspecified severity: Secondary | ICD-10-CM | POA: Diagnosis not present

## 2019-11-17 DIAGNOSIS — A419 Sepsis, unspecified organism: Secondary | ICD-10-CM | POA: Diagnosis present

## 2019-11-17 DIAGNOSIS — E11628 Type 2 diabetes mellitus with other skin complications: Secondary | ICD-10-CM | POA: Diagnosis present

## 2019-11-17 DIAGNOSIS — Z20822 Contact with and (suspected) exposure to covid-19: Secondary | ICD-10-CM | POA: Diagnosis present

## 2019-11-17 DIAGNOSIS — E1169 Type 2 diabetes mellitus with other specified complication: Secondary | ICD-10-CM | POA: Diagnosis present

## 2019-11-17 DIAGNOSIS — I129 Hypertensive chronic kidney disease with stage 1 through stage 4 chronic kidney disease, or unspecified chronic kidney disease: Secondary | ICD-10-CM | POA: Diagnosis present

## 2019-11-17 DIAGNOSIS — Z83438 Family history of other disorder of lipoprotein metabolism and other lipidemia: Secondary | ICD-10-CM

## 2019-11-17 DIAGNOSIS — E785 Hyperlipidemia, unspecified: Secondary | ICD-10-CM | POA: Diagnosis present

## 2019-11-17 DIAGNOSIS — L089 Local infection of the skin and subcutaneous tissue, unspecified: Secondary | ICD-10-CM

## 2019-11-17 DIAGNOSIS — Z880 Allergy status to penicillin: Secondary | ICD-10-CM

## 2019-11-17 DIAGNOSIS — L97509 Non-pressure chronic ulcer of other part of unspecified foot with unspecified severity: Secondary | ICD-10-CM | POA: Diagnosis present

## 2019-11-17 DIAGNOSIS — E1122 Type 2 diabetes mellitus with diabetic chronic kidney disease: Secondary | ICD-10-CM | POA: Diagnosis present

## 2019-11-17 DIAGNOSIS — M86672 Other chronic osteomyelitis, left ankle and foot: Secondary | ICD-10-CM | POA: Diagnosis not present

## 2019-11-17 DIAGNOSIS — Z833 Family history of diabetes mellitus: Secondary | ICD-10-CM | POA: Diagnosis not present

## 2019-11-17 DIAGNOSIS — N179 Acute kidney failure, unspecified: Secondary | ICD-10-CM | POA: Diagnosis present

## 2019-11-17 DIAGNOSIS — E10621 Type 1 diabetes mellitus with foot ulcer: Secondary | ICD-10-CM | POA: Diagnosis not present

## 2019-11-17 DIAGNOSIS — Z1629 Resistance to other single specified antibiotic: Secondary | ICD-10-CM | POA: Diagnosis present

## 2019-11-17 DIAGNOSIS — N182 Chronic kidney disease, stage 2 (mild): Secondary | ICD-10-CM | POA: Diagnosis present

## 2019-11-17 DIAGNOSIS — L97529 Non-pressure chronic ulcer of other part of left foot with unspecified severity: Secondary | ICD-10-CM | POA: Diagnosis present

## 2019-11-17 LAB — COMPREHENSIVE METABOLIC PANEL WITH GFR
ALT: 30 U/L (ref 0–44)
AST: 36 U/L (ref 15–41)
Albumin: 3.1 g/dL — ABNORMAL LOW (ref 3.5–5.0)
Alkaline Phosphatase: 100 U/L (ref 38–126)
Anion gap: 11 (ref 5–15)
BUN: 37 mg/dL — ABNORMAL HIGH (ref 6–20)
CO2: 25 mmol/L (ref 22–32)
Calcium: 10.1 mg/dL (ref 8.9–10.3)
Chloride: 96 mmol/L — ABNORMAL LOW (ref 98–111)
Creatinine, Ser: 2.94 mg/dL — ABNORMAL HIGH (ref 0.61–1.24)
GFR calc Af Amer: 26 mL/min — ABNORMAL LOW (ref >60)
GFR calc non Af Amer: 22 mL/min — ABNORMAL LOW (ref >60)
Glucose, Bld: 195 mg/dL — ABNORMAL HIGH (ref 70–99)
Potassium: 4.8 mmol/L (ref 3.5–5.1)
Sodium: 132 mmol/L — ABNORMAL LOW (ref 135–145)
Total Bilirubin: 1 mg/dL (ref 0.3–1.2)
Total Protein: 7.9 g/dL (ref 6.5–8.1)

## 2019-11-17 LAB — GLUCOSE, CAPILLARY: Glucose-Capillary: 398 mg/dL — ABNORMAL HIGH (ref 70–99)

## 2019-11-17 LAB — CBC WITH DIFFERENTIAL/PLATELET
Abs Immature Granulocytes: 0.09 K/uL — ABNORMAL HIGH (ref 0.00–0.07)
Basophils Absolute: 0.1 K/uL (ref 0.0–0.1)
Basophils Relative: 0 %
Eosinophils Absolute: 0.3 K/uL (ref 0.0–0.5)
Eosinophils Relative: 2 %
HCT: 41.4 % (ref 39.0–52.0)
Hemoglobin: 12.9 g/dL — ABNORMAL LOW (ref 13.0–17.0)
Immature Granulocytes: 1 %
Lymphocytes Relative: 16 %
Lymphs Abs: 2.4 K/uL (ref 0.7–4.0)
MCH: 23 pg — ABNORMAL LOW (ref 26.0–34.0)
MCHC: 31.2 g/dL (ref 30.0–36.0)
MCV: 73.7 fL — ABNORMAL LOW (ref 80.0–100.0)
Monocytes Absolute: 1.7 K/uL — ABNORMAL HIGH (ref 0.1–1.0)
Monocytes Relative: 12 %
Neutro Abs: 10.2 K/uL — ABNORMAL HIGH (ref 1.7–7.7)
Neutrophils Relative %: 69 %
Platelets: 441 K/uL — ABNORMAL HIGH (ref 150–400)
RBC: 5.62 MIL/uL (ref 4.22–5.81)
RDW: 13.9 % (ref 11.5–15.5)
WBC: 14.7 K/uL — ABNORMAL HIGH (ref 4.0–10.5)
nRBC: 0 % (ref 0.0–0.2)

## 2019-11-17 LAB — CBG MONITORING, ED: Glucose-Capillary: 318 mg/dL — ABNORMAL HIGH (ref 70–99)

## 2019-11-17 LAB — HEMOGLOBIN A1C
Hgb A1c MFr Bld: 8.3 % — ABNORMAL HIGH (ref 4.8–5.6)
Mean Plasma Glucose: 191.51 mg/dL

## 2019-11-17 LAB — LACTIC ACID, PLASMA
Lactic Acid, Venous: 1.5 mmol/L (ref 0.5–1.9)
Lactic Acid, Venous: 1.7 mmol/L (ref 0.5–1.9)

## 2019-11-17 LAB — SARS CORONAVIRUS 2 (TAT 6-24 HRS): SARS Coronavirus 2: NEGATIVE

## 2019-11-17 MED ORDER — VANCOMYCIN HCL 750 MG/150ML IV SOLN
750.0000 mg | INTRAVENOUS | Status: DC
  Administered 2019-11-19: 07:00:00 750 mg via INTRAVENOUS
  Filled 2019-11-17: qty 150

## 2019-11-17 MED ORDER — SODIUM CHLORIDE 0.9 % IV SOLN: 2.0000 g | Freq: Once | INTRAVENOUS | Status: DC

## 2019-11-17 MED ORDER — INSULIN ASPART 100 UNIT/ML ~~LOC~~ SOLN
0.0000 [IU] | Freq: Three times a day (TID) | SUBCUTANEOUS | Status: DC
  Administered 2019-11-17: 11 [IU] via SUBCUTANEOUS
  Administered 2019-11-18: 12:00:00 3 [IU] via SUBCUTANEOUS

## 2019-11-17 MED ORDER — VANCOMYCIN HCL 2000 MG/400ML IV SOLN
2000.0000 mg | Freq: Once | INTRAVENOUS | Status: AC
  Administered 2019-11-18: 03:00:00 2000 mg via INTRAVENOUS
  Filled 2019-11-17: qty 400

## 2019-11-17 MED ORDER — HYDRALAZINE HCL 25 MG PO TABS: 25.0000 mg | ORAL_TABLET | Freq: Four times a day (QID) | ORAL | Status: DC | PRN

## 2019-11-17 MED ORDER — PANTOPRAZOLE SODIUM 40 MG PO TBEC
40.0000 mg | DELAYED_RELEASE_TABLET | Freq: Every day | ORAL | Status: DC
  Administered 2019-11-18 – 2019-11-21 (×4): 40 mg via ORAL
  Filled 2019-11-17 (×4): qty 1

## 2019-11-17 MED ORDER — INSULIN ASPART 100 UNIT/ML ~~LOC~~ SOLN
8.0000 [IU] | Freq: Once | SUBCUTANEOUS | Status: AC
  Administered 2019-11-17: 8 [IU] via SUBCUTANEOUS

## 2019-11-17 MED ORDER — METRONIDAZOLE IN NACL 5-0.79 MG/ML-% IV SOLN
500.0000 mg | Freq: Three times a day (TID) | INTRAVENOUS | Status: DC
  Administered 2019-11-17 – 2019-11-21 (×12): 500 mg via INTRAVENOUS
  Filled 2019-11-17 (×12): qty 100

## 2019-11-17 MED ORDER — ATORVASTATIN CALCIUM 10 MG PO TABS
20.0000 mg | ORAL_TABLET | Freq: Every day | ORAL | Status: DC
  Administered 2019-11-17 – 2019-11-21 (×5): 20 mg via ORAL
  Filled 2019-11-17 (×6): qty 2

## 2019-11-17 MED ORDER — INSULIN ASPART 100 UNIT/ML ~~LOC~~ SOLN: 0.0000 [IU] | Freq: Every day | SUBCUTANEOUS | Status: DC

## 2019-11-17 MED ORDER — SODIUM CHLORIDE 0.9 % IV SOLN
2.0000 g | Freq: Two times a day (BID) | INTRAVENOUS | Status: DC
  Administered 2019-11-18 – 2019-11-21 (×7): 2 g via INTRAVENOUS
  Filled 2019-11-17 (×7): qty 2

## 2019-11-17 MED ORDER — SODIUM CHLORIDE 0.9 % IV SOLN: INTRAVENOUS | Status: DC

## 2019-11-17 MED ORDER — LORAZEPAM 2 MG/ML IJ SOLN
0.5000 mg | Freq: Once | INTRAMUSCULAR | Status: AC
  Administered 2019-11-17: 23:00:00 0.5 mg via INTRAVENOUS
  Filled 2019-11-17: qty 1

## 2019-11-17 MED ORDER — ACETAMINOPHEN 325 MG PO TABS: 650.0000 mg | ORAL_TABLET | Freq: Four times a day (QID) | ORAL | Status: DC | PRN

## 2019-11-17 MED ORDER — INSULIN GLARGINE 100 UNIT/ML ~~LOC~~ SOLN
20.0000 [IU] | Freq: Every day | SUBCUTANEOUS | Status: DC
  Administered 2019-11-17 – 2019-11-19 (×3): 20 [IU] via SUBCUTANEOUS
  Filled 2019-11-17 (×4): qty 0.2

## 2019-11-17 MED ORDER — HYDROCODONE-ACETAMINOPHEN 5-325 MG PO TABS
1.0000 | ORAL_TABLET | Freq: Four times a day (QID) | ORAL | Status: DC | PRN
  Administered 2019-11-17 – 2019-11-18 (×3): 1 via ORAL
  Filled 2019-11-17 (×4): qty 1

## 2019-11-17 MED ORDER — ASPIRIN EC 81 MG PO TBEC
81.0000 mg | DELAYED_RELEASE_TABLET | Freq: Every day | ORAL | Status: DC
  Administered 2019-11-18 – 2019-11-21 (×3): 81 mg via ORAL
  Filled 2019-11-17 (×4): qty 1

## 2019-11-17 MED ORDER — ACETAMINOPHEN 650 MG RE SUPP: 650.0000 mg | Freq: Four times a day (QID) | RECTAL | Status: DC | PRN

## 2019-11-17 MED ORDER — HYDROMORPHONE HCL 1 MG/ML IJ SOLN
0.5000 mg | INTRAMUSCULAR | Status: DC | PRN
  Administered 2019-11-18 – 2019-11-20 (×3): 0.5 mg via INTRAVENOUS
  Filled 2019-11-17 (×4): qty 1

## 2019-11-17 MED ORDER — VANCOMYCIN HCL 1500 MG/300ML IV SOLN: 1500.0000 mg | Freq: Once | INTRAVENOUS | Status: DC

## 2019-11-17 MED ORDER — PIPERACILLIN-TAZOBACTAM 3.375 G IVPB 30 MIN: 3.3750 g | Freq: Once | INTRAVENOUS | Status: DC

## 2019-11-17 MED ORDER — SODIUM CHLORIDE 0.9% FLUSH: 3.0000 mL | Freq: Once | INTRAVENOUS | Status: DC

## 2019-11-17 MED ORDER — CARVEDILOL 6.25 MG PO TABS
6.2500 mg | ORAL_TABLET | Freq: Two times a day (BID) | ORAL | Status: DC
  Administered 2019-11-17 – 2019-11-21 (×8): 6.25 mg via ORAL
  Filled 2019-11-17 (×8): qty 1

## 2019-11-17 MED ORDER — HEPARIN SODIUM (PORCINE) 5000 UNIT/ML IJ SOLN
5000.0000 [IU] | Freq: Two times a day (BID) | INTRAMUSCULAR | Status: DC
  Administered 2019-11-17 – 2019-11-18 (×2): 5000 [IU] via SUBCUTANEOUS
  Filled 2019-11-17 (×2): qty 1

## 2019-11-17 NOTE — ED Triage Notes (Signed)
Pt with diabetic foot ulcer is here due to worsening of this foot and severe pain.  Pt had a surgery to this left foot about 3 weeks ago.  Pt states that the wound has been getting more painful, draining more and the drainage has been malodorous.  Pt also has been having chills with this. Pt states that Dr. Earleen Newport with [facility] did the procedure 3 weeks ago.

## 2019-11-17 NOTE — ED Notes (Signed)
Attempted to start IV but pt's family member upset and stating that the plan is for a PICC line then d/c with abx. Pt and family do not want to get admitted. Pt and family requesting to speak with MD again. MD made aware.

## 2019-11-17 NOTE — Progress Notes (Signed)
Pt arrived to unit accompanied by wife and staff in stretcher from ED. Alert/oriented in no acute distress. Situated to room and oreintated to equipments as well. Hand book provided/menu VSS.. Call bell and telephone within reach, Bed wheels locked and 3 side rails up. No complaints voiced.
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
## 2019-11-17 NOTE — Progress Notes (Signed)
Subjective: 59 year old male presents the office for follow-up evaluation of an ulceration of the left foot.  He states he had increased pain to the area is noticed some drainage coming from the area as well as a mild odor.  Denies any increase in swelling or redness to the foot.  No pus.  Denies any fevers, chills, nausea, vomiting.  No calf pain, chest pain, shortness of breath.   Objective: AAO x3, NAD DP, PT pulses palpable 2/4.  On the left foot submetatarsal 3 with fibrogranular wound bed with surrounding hyperkeratotic tissue.  Distal to this area is some epidermolysis which is able to debride today.  There is no purulence.  There is no significant increase in swelling or warmth of foot.  No ascending cellulitis. No pain with calf compression, swelling, warmth, erythema  Assessment: 59 year old male left foot ulceration  Plan: -All treatment options discussed with the patient including all alternatives, risks, complications.  -X-rays obtained reviewed.  No definitive evidence of acute osteomyelitis.  There are some soft tissue lucency distally but this corresponds to the area of the wound. -I sharply debrided the wound today utilizing a 312 with scalpel down to healthy, granular tissue.  Wound culture was obtained today.  I debrided also the hyperkeratotic periwound as well as the area of epidermal lysis.  Santyl daily.  Doxycycline.  Darco wedge shoe. -Ultimately discussed with him if infection spreads likely amputation we discussed transmetatarsal amputation.  RTC 1 week or sooner if needed  Trula Slade DPM

## 2019-11-17 NOTE — Progress Notes (Signed)
Pharmacy Antibiotic Note  [facility] is a 59 y.o. male admitted on 11/17/2019 with wound infection.  Pharmacy has been consulted for vancomycin/cefepime dosing. Flagyl also ordered per MD. Noted "itching, swelling, rash" allergy to PCN with no history of beta lactam usage in chart. Per discussion with Dr. Roosevelt Locks, changed Zosyn to cefepime and Flagyl and will monitor. Patient recently s/p I&D x2 of L foot stump and placed on doxy PTA. AKI - SCr 2.94 on admit (most recent SCr was 2.02 from 05/2018).  Plan: Cefepime 2g IV q12h Vancomycin 2g IV x1; then Vancomycin 750 mg IV Q 24 hrs. Goal AUC 400-550. Expected AUC: 484 SCr used: 2.94 Monitor clinical progress, c/s, renal function F/u de-escalation plan/LOT, vancomycin levels as indicated   Weight: 235 lb (106.6 kg)  Temp (24hrs), Avg:98.7 F (37.1 C), Min:98.4 F (36.9 C), Max:98.9 F (37.2 C)  Recent Labs  Lab 11/17/19 1226 11/17/19 1605  WBC 14.7*  --   CREATININE 2.94*  --   LATICACIDVEN 1.5 1.7    CrCl cannot be calculated (Unknown ideal weight.).    Allergies  Allergen Reactions  . Latex Hives  . Tape Hives  . Chlorhexidine Itching and Rash    Develops skin irritation   . Fish Allergy Hives and Rash    Patient unsure which fish  . Penicillins Itching, Swelling and Rash    Did it involve swelling of the face/tongue/throat, SOB, or low BP? Yes Did it involve sudden or severe rash/hives, skin peeling, or any reaction on the inside of your mouth or nose? No Did you need to seek medical attention at a hospital or doctor's office? Yes When did it last happen?34-31 years old If all above answers are "NO", may proceed with cephalosporin use.     Antimicrobials this admission: 3/29 vancomycin >>  3/29 cefepime >>   Dose adjustments this admission:   Microbiology results:   Arturo Morton, PharmD, BCPS Please check AMION for all [place] contact numbers Clinical Pharmacist 11/17/2019 5:18 PM
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
## 2019-11-17 NOTE — ED Notes (Signed)
Per MRI call them when patient has IV access.
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
## 2019-11-18 DIAGNOSIS — E1169 Type 2 diabetes mellitus with other specified complication: Secondary | ICD-10-CM

## 2019-11-18 DIAGNOSIS — M869 Osteomyelitis, unspecified: Secondary | ICD-10-CM

## 2019-11-18 DIAGNOSIS — M86672 Other chronic osteomyelitis, left ankle and foot: Secondary | ICD-10-CM

## 2019-11-18 LAB — URINALYSIS, ROUTINE W REFLEX MICROSCOPIC
Bilirubin Urine: NEGATIVE
Glucose, UA: NEGATIVE mg/dL
Hgb urine dipstick: NEGATIVE
Ketones, ur: NEGATIVE mg/dL
Leukocytes,Ua: NEGATIVE
Nitrite: NEGATIVE
Protein, ur: NEGATIVE mg/dL
Specific Gravity, Urine: 1.012 (ref 1.005–1.030)
pH: 5 (ref 5.0–8.0)

## 2019-11-18 LAB — GLUCOSE, CAPILLARY
Glucose-Capillary: 134 mg/dL — ABNORMAL HIGH (ref 70–99)
Glucose-Capillary: 173 mg/dL — ABNORMAL HIGH (ref 70–99)
Glucose-Capillary: 411 mg/dL — ABNORMAL HIGH (ref 70–99)
Glucose-Capillary: 381 mg/dL — ABNORMAL HIGH (ref 70–99)
Glucose-Capillary: 38 mg/dL — CL (ref 70–99)
Glucose-Capillary: 82 mg/dL (ref 70–99)

## 2019-11-18 LAB — BASIC METABOLIC PANEL WITH GFR
Anion gap: 14 (ref 5–15)
BUN: 43 mg/dL — ABNORMAL HIGH (ref 6–20)
CO2: 21 mmol/L — ABNORMAL LOW (ref 22–32)
Calcium: 9.3 mg/dL (ref 8.9–10.3)
Chloride: 95 mmol/L — ABNORMAL LOW (ref 98–111)
Creatinine, Ser: 2.7 mg/dL — ABNORMAL HIGH (ref 0.61–1.24)
GFR calc Af Amer: 29 mL/min — ABNORMAL LOW (ref >60)
GFR calc non Af Amer: 25 mL/min — ABNORMAL LOW (ref >60)
Glucose, Bld: 125 mg/dL — ABNORMAL HIGH (ref 70–99)
Potassium: 4.4 mmol/L (ref 3.5–5.1)
Sodium: 130 mmol/L — ABNORMAL LOW (ref 135–145)

## 2019-11-18 LAB — CBC
HCT: 36.2 % — ABNORMAL LOW (ref 39.0–52.0)
Hemoglobin: 11.3 g/dL — ABNORMAL LOW (ref 13.0–17.0)
MCH: 22.6 pg — ABNORMAL LOW (ref 26.0–34.0)
MCHC: 31.2 g/dL (ref 30.0–36.0)
MCV: 72.4 fL — ABNORMAL LOW (ref 80.0–100.0)
Platelets: 345 K/uL (ref 150–400)
RBC: 5 MIL/uL (ref 4.22–5.81)
RDW: 13.6 % (ref 11.5–15.5)
WBC: 12.7 K/uL — ABNORMAL HIGH (ref 4.0–10.5)
nRBC: 0.2 % (ref 0.0–0.2)

## 2019-11-18 LAB — HIV ANTIBODY (ROUTINE TESTING W REFLEX): HIV Screen 4th Generation wRfx: NONREACTIVE

## 2019-11-18 MED ORDER — INSULIN ASPART 100 UNIT/ML ~~LOC~~ SOLN
0.0000 [IU] | Freq: Every day | SUBCUTANEOUS | Status: DC
  Administered 2019-11-19: 4 [IU] via SUBCUTANEOUS
  Administered 2019-11-20: 3 [IU] via SUBCUTANEOUS

## 2019-11-18 MED ORDER — INSULIN ASPART 100 UNIT/ML ~~LOC~~ SOLN
6.0000 [IU] | Freq: Three times a day (TID) | SUBCUTANEOUS | Status: DC
  Administered 2019-11-18 – 2019-11-20 (×4): 6 [IU] via SUBCUTANEOUS

## 2019-11-18 MED ORDER — LACTATED RINGERS IV SOLN: INTRAVENOUS | Status: AC

## 2019-11-18 MED ORDER — OXYCODONE HCL 5 MG PO TABS
10.0000 mg | ORAL_TABLET | ORAL | Status: DC | PRN
  Administered 2019-11-19 – 2019-11-20 (×3): 10 mg via ORAL
  Filled 2019-11-18 (×4): qty 2

## 2019-11-18 MED ORDER — INSULIN ASPART 100 UNIT/ML ~~LOC~~ SOLN
0.0000 [IU] | Freq: Three times a day (TID) | SUBCUTANEOUS | Status: DC
  Administered 2019-11-18: 20 [IU] via SUBCUTANEOUS
  Administered 2019-11-19: 15 [IU] via SUBCUTANEOUS
  Administered 2019-11-19: 7 [IU] via SUBCUTANEOUS
  Administered 2019-11-20 (×2): 4 [IU] via SUBCUTANEOUS
  Administered 2019-11-20: 7 [IU] via SUBCUTANEOUS

## 2019-11-18 MED ORDER — INSULIN ASPART 100 UNIT/ML ~~LOC~~ SOLN
20.0000 [IU] | Freq: Once | SUBCUTANEOUS | Status: AC
  Administered 2019-11-18: 20 [IU] via SUBCUTANEOUS

## 2019-11-18 MED ORDER — HEPARIN SODIUM (PORCINE) 5000 UNIT/ML IJ SOLN
5000.0000 [IU] | Freq: Three times a day (TID) | INTRAMUSCULAR | Status: AC
  Administered 2019-11-18: 5000 [IU] via SUBCUTANEOUS
  Filled 2019-11-18: qty 1

## 2019-11-18 MED ORDER — OXYCODONE HCL 5 MG PO TABS
5.0000 mg | ORAL_TABLET | ORAL | Status: DC | PRN
  Administered 2019-11-18 – 2019-11-21 (×2): 5 mg via ORAL
  Filled 2019-11-18: qty 1

## 2019-11-18 MED ORDER — ACETAMINOPHEN 500 MG PO TABS
1000.0000 mg | ORAL_TABLET | Freq: Three times a day (TID) | ORAL | Status: DC
  Administered 2019-11-18 – 2019-11-20 (×5): 1000 mg via ORAL
  Filled 2019-11-18 (×5): qty 2

## 2019-11-18 MED ORDER — AMLODIPINE BESYLATE 10 MG PO TABS
10.0000 mg | ORAL_TABLET | Freq: Every day | ORAL | Status: DC
  Administered 2019-11-18 – 2019-11-21 (×4): 10 mg via ORAL
  Filled 2019-11-18 (×4): qty 1

## 2019-11-18 MED ORDER — DIPHENHYDRAMINE HCL 50 MG/ML IJ SOLN
25.0000 mg | Freq: Once | INTRAMUSCULAR | Status: AC
  Administered 2019-11-18: 25 mg via INTRAVENOUS
  Filled 2019-11-18: qty 1

## 2019-11-18 NOTE — Progress Notes (Signed)
Contacted Dr. Sharlet Salina to notify of call from [facility] to alert of [employer] [facility] and 398 CBG.  VSS and patient denies SOB and chest pain.  Orders received for EKG and dose of 8 units Novolog.  MD states to hold SSI.

## 2019-11-18 NOTE — Progress Notes (Signed)
Subjective: Patient was admitted for worsening ulceration and cellulitis of his left foot.  MRI was obtained which was worrisome for infection.  Given the ulceration as well as infection clinically I do think there is osteomyelitis.  Patient states he is ready for surgery.  His wife is at bedside today.  Overall the swelling to his foot and leg has improved. Denies any systemic complaints such as fevers, chills, nausea, vomiting.   Objective: AAO x3, NAD DP/PT pulses palpable bilaterally, CRT less than 3 seconds There is decreased edema to the foot and leg with there is still localized edema to the forefoot.  There is tenderness palpation submetatarsal area of the ulceration.  There is fibrotic as well as drainage coming from the ulceration.  No frank purulence but serosanguineous drainage.  Unable to probe to bone today.  Tenderness palpation of the wound. No open lesions or pre-ulcerative lesions.  No pain with calf compression, swelling, warmth, erythema  Assessment: Ulceration with osteomyelitis  Plan: -Given clinical picture as well as MRI findings I discussed with him transmetatarsal amputation.  We discussed the surgery as well as postoperative course.  Discussed alternatives, risks, complications.  We discussed pros and cons of limb salvage versus amputation and he elects to proceed with amputation.  We discussed risks of surgery including, but not limited to, spread of infection, need for further surgery, further amputation, transfer lesions as well as general risks of surgery including stroke, heart attack, death.  Plan for TMA tomorrow. NPO.   Celesta Gentile, DPM O: [phone] C: [phone]

## 2019-11-18 NOTE — Progress Notes (Signed)
PROGRESS NOTE    JC VERON  ZOX:[id number] DOB: 03/26/1961 DOA: 11/17/2019 PCP: Julian Hy, PA-C   Brief Narrative:  Anthony Flowers is Anthony Flowers 59 y.o. 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  Patient was seen by podiatrist about 3 days ago, another debridement (office) was done and recommendation was to continue antibiotics.  Over last 2 days patient started to have increasing left foot pain, swelling and rash toward the mid shin level of the left side, with fever with T-max 103.2 last night.  Assessment & Plan:   Active Problems:   DM foot ulcer ([place])  Diabetic Foot Infection  Osteomyelitis:  Wound cx from 3/25 with staph aureus and strep agalactiae (prelim - sensitivities for staph, not resulted) MRI 3/29 with ill defined fluid collections adjacent to 3rd and 4th metatarsal heads suspicious for abscess failed outpatient ABX and surgery treatment Sepsis evidenced by fever and elevated white count and evidence of infection  Continue vanc/cefepime/flagyl Blood cultures x 2 (these were collected after abx) Follow ABI's Appreciate Dr. Leigh Flowers assistance, plan for surgery tomorrow  AKI on CKD stage II Hold his ARB and HCTZ UA is bland Baseline creatinine from 2019 appears to be ~ 2 On presentation, 2.94 -> improving today with IVF  HTN Continue beta-blocker and amlodipine Hold ARB and HCTZ   IDDM Lantus plus sliding scale for now  HLD On statin  DVT prophylaxis: heparin, SCD's - hold heparin after 2200 tonight with surgery tomorrow Code Status: full Family Communication: wife at bedside Disposition Plan:  . Patient came from: home            . Anticipated d/c place: home . Barriers to d/c OR conditions which  need to be met to effect Razan Siler safe d/c: pending surgery with podiatry   Consultants:   podiatry  Procedures:   none  Antimicrobials: Anti-infectives (From admission, onward)   Start     Dose/Rate Route Frequency Ordered Stop   11/19/19 0500  vancomycin (VANCOREADY) IVPB 750 mg/150 mL     750 mg 150 mL/hr over 60 Minutes Intravenous Every 24 hours 11/17/19 1737     11/17/19 1900  metroNIDAZOLE (FLAGYL) IVPB 500 mg     500 mg 100 mL/hr over 60 Minutes Intravenous Every 8 hours 11/17/19 1807     11/17/19 1815  ceFEPIme (MAXIPIME) 2 g in sodium chloride 0.9 % 100 mL IVPB  Status:  Discontinued     2 g 200 mL/hr over 30 Minutes Intravenous  Once 11/17/19 1806 11/17/19 1807   11/17/19 1815  ceFEPIme (MAXIPIME) 2 g in sodium chloride 0.9 % 100 mL IVPB     2 g 200 mL/hr over 30 Minutes Intravenous Every 12 hours 11/17/19 1807     11/17/19 1745  vancomycin (VANCOREADY) IVPB 2000 mg/400 mL     2,000 mg 200 mL/hr over 120 Minutes Intravenous  Once 11/17/19 1734 11/18/19 0458   11/17/19 1645  vancomycin (VANCOREADY) IVPB 1500 mg/300 mL  Status:  Discontinued     1,500 mg 150 mL/hr over 120 Minutes Intravenous  Once 11/17/19 1636 11/17/19 1734   11/17/19 1515  piperacillin-tazobactam (ZOSYN) IVPB 3.375 g  Status:  Discontinued     3.375 g 100 mL/hr over 30 Minutes Intravenous  Once 11/17/19 1505 11/17/19 1734     Subjective: Asking about surgery Was hoping it'd be today  Objective: Vitals:   11/17/19 2138 11/18/19 0327 11/18/19 0748 11/18/19 1416  BP: 119/72 103/61 121/70 118/69  Pulse: 89 79 86 80  Resp: '18 18 17 18  ' Temp: 98 F (36.7 C) 98.2 F (36.8 C) 99.5 F (37.5 C) 98.1 F (36.7 C)  TempSrc: Oral Oral Oral Oral  SpO2: 98% 100% 97% 93%  Weight:      Height:        Intake/Output Summary (Last 24 hours) at 11/18/2019 1427 Last data filed at 11/18/2019 0900 Gross per 24 hour  Intake 520.09 ml  Output 1451 ml  Net -930.91 ml   Filed Weights   11/17/19 1215 11/17/19  1700  Weight: 106.6 kg 106.6 kg    Examination:  General exam: Appears calm and comfortable  Respiratory system: Clear to auscultation. Respiratory effort normal. Cardiovascular system: S1 & S2 heard, RRR.  Gastrointestinal system: Abdomen is nondistended, soft and nontender. Central nervous system: Alert and oriented. No focal neurological deficits. Extremities: LLE with diabetic foot wound in dressing, swelling and tenderness to dorsum of foot Skin: No rashes, lesions or ulcers Psychiatry: Judgement and insight appear normal. Mood & affect appropriate.     Data Reviewed: I have personally reviewed following labs and imaging studies  CBC: Recent Labs  Lab 11/17/19 1226 11/18/19 0629  WBC 14.7* 12.7*  NEUTROABS 10.2*  --   HGB 12.9* 11.3*  HCT 41.4 36.2*  MCV 73.7* 72.4*  PLT 441* 161   Basic Metabolic Panel: Recent Labs  Lab 11/17/19 1226 11/18/19 0629  NA 132* 130*  K 4.8 4.4  CL 96* 95*  CO2 25 21*  GLUCOSE 195* 125*  BUN 37* 43*  CREATININE 2.94* 2.70*  CALCIUM 10.1 9.3   GFR: Estimated Creatinine Clearance: 37.2 mL/min (Anthony Flowers) (by C-G formula based on SCr of 2.7 mg/dL (H)). Liver Function Tests: Recent Labs  Lab 11/17/19 1226  AST 36  ALT 30  ALKPHOS 100  BILITOT 1.0  PROT 7.9  ALBUMIN 3.1*   No results for input(s): LIPASE, AMYLASE in the last 168 hours. No results for input(s): AMMONIA in the last 168 hours. Coagulation Profile: No results for input(s): INR, PROTIME in the last 168 hours. Cardiac Enzymes: No results for input(s): CKTOTAL, CKMB, CKMBINDEX, TROPONINI in the last 168 hours. BNP (last 3 results) No results for input(s): PROBNP in the last 8760 hours. HbA1C: Recent Labs    11/17/19 1822  HGBA1C 8.3*   CBG: Recent Labs  Lab 11/17/19 1647 11/17/19 2143 11/18/19 0631 11/18/19 1128  GLUCAP 318* 398* 134* 173*   Lipid Profile: No results for input(s): CHOL, HDL, LDLCALC, TRIG, CHOLHDL, LDLDIRECT in the last 72 hours. Thyroid  Function Tests: No results for input(s): TSH, T4TOTAL, FREET4, T3FREE, THYROIDAB in the last 72 hours. Anemia Panel: No results for input(s): VITAMINB12, FOLATE, FERRITIN, TIBC, IRON, RETICCTPCT in the last 72 hours. Sepsis Labs: Recent Labs  Lab 11/17/19 1226 11/17/19 1605  LATICACIDVEN 1.5 1.7    Recent Results (from the past 240 hour(s))  WOUND CULTURE     Status: Abnormal (Preliminary result)   Collection Time: 11/13/19  5:12 PM   Specimen: Foot, Left; Wound  Result Value Ref Range Status   MICRO NUMBER: [id number]  Preliminary   SPECIMEN QUALITY: Adequate  Preliminary   SOURCE: WOUND (SITE NOT SPECIFIED)  Preliminary   STATUS: PRELIMINARY  Preliminary   GRAM STAIN:  Preliminary    No white blood cells seen No epithelial cells seen Few Gram positive cocci in pairs   ISOLATE 1: Staphylococcus aureus ([place])  Preliminary    Comment: Moderate growth of Staphylococcus aureus   ISOLATE 2: Streptococcus agalactiae (Donnae Michels)  Preliminary    Comment: Heavy growth of Group B Streptococcus isolated Beta-hemolytic streptococci are predictably susceptible to Penicillin and other beta-lactams. Susceptibility testing not routinely performed. Please contact the laboratory within 3 days if susceptibility  testing is desired.   SARS CORONAVIRUS 2 (TAT 6-24 HRS) Nasopharyngeal Nasopharyngeal Swab     Status: None   Collection Time: 11/17/19  4:09 PM   Specimen: Nasopharyngeal Swab  Result Value Ref Range Status   SARS Coronavirus 2 NEGATIVE NEGATIVE Final    Comment: (NOTE) SARS-CoV-2 target nucleic acids are NOT DETECTED. The SARS-CoV-2 RNA is generally detectable in upper and lower respiratory specimens during the acute phase of infection. Negative results do not preclude SARS-CoV-2 infection, do not rule out co-infections with other pathogens, and should not be used as the sole basis for treatment or other patient management decisions. Negative results must be combined with clinical  observations, patient history, and epidemiological information. The expected result is Negative. Fact Sheet for Patients: SugarRoll.be Fact Sheet for Healthcare Providers: https://www.woods-mathews.com/ This test is not yet approved or cleared by the [place] FDA and  has been authorized for detection and/or diagnosis of SARS-CoV-2 by FDA under an Emergency Use Authorization (EUA). This EUA will remain  in effect (meaning this test can be used) for the duration of the COVID-19 declaration under Section 56 4(b)(1) of the Act, 21 U.S.C. section 360bbb-3(b)(1), unless the authorization is terminated or revoked sooner. Performed at [facility], [place] [address], [place], [place] [address]          Radiology Studies: MR FOOT LEFT WO CONTRAST  Result Date: 11/17/2019 CLINICAL DATA:  Diabetic foot ulcer. Failed outpatient antibiotic therapy. Fever and leukocytosis with impending sepsis. Evaluate for osteomyelitis. EXAM: MRI OF THE LEFT FOOT WITHOUT CONTRAST TECHNIQUE: Multiplanar, multisequence MR imaging of the left forefoot was performed. No intravenous contrast was administered. COMPARISON:  Prior foot radiographs ranging from Raistlin Gum 05/27/2018 through 11/17/2019. FINDINGS: Bones/Joint/Cartilage Patient is status post remote amputation of the 4th and 5th toes with amputation through the mid 5th metatarsal. There is marrow T2 hyperintensity within the 3rd metatarsal head, and to Tanairy Payeur lesser degree the 4th metatarsal head. There is mild marrow edema within the base of the 3rd proximal phalanx as well and Naquan Flowers small 3rd MTP joint effusion. The 2nd ray appears unremarkable. Chronic advanced arthropathy at the 1st MTP joint appears stable. The 5th metatarsal remnant and visualized tarsal bones appear normal. Ligaments Intact Lisfranc ligament. Muscles and Tendons Diffuse forefoot muscular atrophy. No significant tenosynovitis. Soft tissues There are  inflammatory changes with ill-defined fluid collections in the lateral aspect of the forefoot. There is apparent focal skin ulceration plantar to the 3rd metatarsal head, best seen on coronal image 20/9. The fluid collections are largest along the dorsal aspects of the 3rd and 4th metatarsals, measuring up to 3.7 x 1.9 x 1.4 cm, suspicious for abscesses. IMPRESSION: 1. Focal skin ulceration plantar to the 3rd metatarsal head with underlying inflammatory changes in the lateral aspect of the forefoot. Ill-defined fluid collections adjacent to the 3rd and 4th metatarsal heads suspicious for abscesses. 2. Marrow T2 hyperintensity within the 3rd metatarsal head and to Florence Antonelli lesser extent the 4th metatarsal head, suspicious for osteomyelitis. 3.  Postsurgical changes from remote amputation of the 4th and 5th toes and amputation through the 5th metatarsal shaft. Electronically Signed   By: Richardean Sale M.D.   On: 11/17/2019 19:01   DG Foot Complete Left  Result Date: 11/18/2019 Please see detailed radiograph report in office note.  DG Foot Complete Left  Result Date: 11/17/2019 CLINICAL DATA:  Worsening diabetic ulcer. EXAM: LEFT FOOT - COMPLETE 3+ VIEW COMPARISON:  11/13/2019 FINDINGS: No change since the study of 4 days ago. Previous amputation of the fourth and fifth toes. No acute evidence of fracture or osteomyelitis. Chronic degenerative changes of the MTP joint of the great toe. IMPRESSION: No change since 4 days ago. Previous fourth and fifth toe amputations. No sign of acute fracture or identifiable osteomyelitis. Electronically Signed   By: Nelson Chimes M.D.   On: 11/17/2019 12:53        Scheduled Meds: . amLODipine  10 mg Oral Daily  . aspirin EC  81 mg Oral Daily  . atorvastatin  20 mg Oral Daily  . carvedilol  6.25 mg Oral BID  . heparin  5,000 Units Subcutaneous Q12H  . insulin aspart  0-15 Units Subcutaneous TID WC  . insulin aspart  0-5 Units Subcutaneous QHS  . insulin glargine  20  Units Subcutaneous QHS  . pantoprazole  40 mg Oral Daily  . sodium chloride flush  3 mL Intravenous Once   Continuous Infusions: . sodium chloride Stopped (11/18/19 0258)  . ceFEPime (MAXIPIME) IV 2 g (11/18/19 0315)  . metronidazole 500 mg (11/18/19 1224)  . [START ON 11/19/2019] vancomycin       LOS: 1 day    Time spent: over 30 min    Fayrene Helper, MD [facility]   To contact the attending provider between 7A-7P or the covering provider during after hours 7P-7A, please log into the web site [email or web address] and access using universal [place] password for that web site. If you do not have the password, please call the hospital operator.  11/18/2019, 2:27 PM

## 2019-11-18 NOTE — Consult Note (Signed)
[place] Nurse Consult Note: Reason for Consult:two nonintact lesions to left plantar foot.  Suspected osteomyelitis.  Recent debridement of wounds.  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 Apply aquacel [facility] # [id number]) to wound bed.  Cover with gauze and kerlix/tape.  Change daily.  Any podiatry orders entered will supercede the [place] orders.  Will not follow at this time.  Please re-consult if needed.  Domenic Moras MSN, RN, FNP-BC CWON Wound, Ostomy, Continence Nurse Pager [phone]

## 2019-11-18 NOTE — Progress Notes (Signed)
Contacted Dr. Sharlet Salina to notify of Sinus Pause and to discuss EKG results at 0300.  Also notified that patient complains of itching while Vancomycin infusing.  Order received for Benadryl.  Benadryl administered to patient, and patient expressed relief from itching.

## 2019-11-18 NOTE — Plan of Care (Signed)

## 2019-11-19 ENCOUNTER — Inpatient Hospital Stay (HOSPITAL_COMMUNITY): Payer: Medicare HMO | Admitting: Certified Registered Nurse Anesthetist

## 2019-11-19 ENCOUNTER — Encounter (HOSPITAL_COMMUNITY): Payer: Medicare HMO

## 2019-11-19 ENCOUNTER — Encounter (HOSPITAL_COMMUNITY)
Admission: EM | Disposition: A | Discharge status: Home Health | Payer: Self-pay | Source: Home / Self Care | Attending: Internal Medicine

## 2019-11-19 ENCOUNTER — Other Ambulatory Visit: Payer: Self-pay

## 2019-11-19 ENCOUNTER — Encounter (HOSPITAL_COMMUNITY): Payer: Self-pay | Admitting: Internal Medicine

## 2019-11-19 DIAGNOSIS — E1169 Type 2 diabetes mellitus with other specified complication: Secondary | ICD-10-CM

## 2019-11-19 DIAGNOSIS — M869 Osteomyelitis, unspecified: Secondary | ICD-10-CM

## 2019-11-19 DIAGNOSIS — E11628 Type 2 diabetes mellitus with other skin complications: Secondary | ICD-10-CM

## 2019-11-19 DIAGNOSIS — L97429 Non-pressure chronic ulcer of left heel and midfoot with unspecified severity: Secondary | ICD-10-CM

## 2019-11-19 DIAGNOSIS — L089 Local infection of the skin and subcutaneous tissue, unspecified: Secondary | ICD-10-CM

## 2019-11-19 DIAGNOSIS — E10621 Type 1 diabetes mellitus with foot ulcer: Secondary | ICD-10-CM

## 2019-11-19 DIAGNOSIS — M86672 Other chronic osteomyelitis, left ankle and foot: Secondary | ICD-10-CM

## 2019-11-19 HISTORY — PX: TRANSMETATARSAL AMPUTATION: SHX6197

## 2019-11-19 LAB — GLUCOSE, CAPILLARY
Glucose-Capillary: 225 mg/dL — ABNORMAL HIGH (ref 70–99)
Glucose-Capillary: 320 mg/dL — ABNORMAL HIGH (ref 70–99)
Glucose-Capillary: 208 mg/dL — ABNORMAL HIGH (ref 70–99)
Glucose-Capillary: 88 mg/dL (ref 70–99)
Glucose-Capillary: 153 mg/dL — ABNORMAL HIGH (ref 70–99)
Glucose-Capillary: 25 mg/dL — CL (ref 70–99)
Glucose-Capillary: 113 mg/dL — ABNORMAL HIGH (ref 70–99)
Glucose-Capillary: 61 mg/dL — ABNORMAL LOW (ref 70–99)
Glucose-Capillary: 305 mg/dL — ABNORMAL HIGH (ref 70–99)

## 2019-11-19 LAB — COMPREHENSIVE METABOLIC PANEL WITH GFR
ALT: 24 U/L (ref 0–44)
AST: 30 U/L (ref 15–41)
Albumin: 2.5 g/dL — ABNORMAL LOW (ref 3.5–5.0)
Alkaline Phosphatase: 100 U/L (ref 38–126)
Anion gap: 12 (ref 5–15)
BUN: 36 mg/dL — ABNORMAL HIGH (ref 6–20)
CO2: 22 mmol/L (ref 22–32)
Calcium: 9.6 mg/dL (ref 8.9–10.3)
Chloride: 94 mmol/L — ABNORMAL LOW (ref 98–111)
Creatinine, Ser: 2.44 mg/dL — ABNORMAL HIGH (ref 0.61–1.24)
GFR calc Af Amer: 32 mL/min — ABNORMAL LOW (ref >60)
GFR calc non Af Amer: 28 mL/min — ABNORMAL LOW (ref >60)
Glucose, Bld: 303 mg/dL — ABNORMAL HIGH (ref 70–99)
Potassium: 4.8 mmol/L (ref 3.5–5.1)
Sodium: 128 mmol/L — ABNORMAL LOW (ref 135–145)
Total Bilirubin: 1 mg/dL (ref 0.3–1.2)
Total Protein: 6.9 g/dL (ref 6.5–8.1)

## 2019-11-19 LAB — CBC WITH DIFFERENTIAL/PLATELET
Abs Immature Granulocytes: 0.09 K/uL — ABNORMAL HIGH (ref 0.00–0.07)
Basophils Absolute: 0.1 K/uL (ref 0.0–0.1)
Basophils Relative: 1 %
Eosinophils Absolute: 0.6 K/uL — ABNORMAL HIGH (ref 0.0–0.5)
Eosinophils Relative: 5 %
HCT: 35.1 % — ABNORMAL LOW (ref 39.0–52.0)
Hemoglobin: 11 g/dL — ABNORMAL LOW (ref 13.0–17.0)
Immature Granulocytes: 1 %
Lymphocytes Relative: 18 %
Lymphs Abs: 2.3 K/uL (ref 0.7–4.0)
MCH: 22.9 pg — ABNORMAL LOW (ref 26.0–34.0)
MCHC: 31.3 g/dL (ref 30.0–36.0)
MCV: 73.1 fL — ABNORMAL LOW (ref 80.0–100.0)
Monocytes Absolute: 1.4 K/uL — ABNORMAL HIGH (ref 0.1–1.0)
Monocytes Relative: 11 %
Neutro Abs: 8 K/uL — ABNORMAL HIGH (ref 1.7–7.7)
Neutrophils Relative %: 64 %
Platelets: 432 K/uL — ABNORMAL HIGH (ref 150–400)
RBC: 4.8 MIL/uL (ref 4.22–5.81)
RDW: 13.8 % (ref 11.5–15.5)
WBC: 12.4 K/uL — ABNORMAL HIGH (ref 4.0–10.5)
nRBC: 0 % (ref 0.0–0.2)

## 2019-11-19 LAB — WOUND CULTURE
MICRO NUMBER:: 10292562
SPECIMEN QUALITY:: ADEQUATE

## 2019-11-19 LAB — PHOSPHORUS: Phosphorus: 3.3 mg/dL (ref 2.5–4.6)

## 2019-11-19 LAB — SURGICAL PCR SCREEN
MRSA, PCR: NEGATIVE
Staphylococcus aureus: NEGATIVE

## 2019-11-19 LAB — MAGNESIUM: Magnesium: 1.5 mg/dL — ABNORMAL LOW (ref 1.7–2.4)

## 2019-11-19 SURGERY — AMPUTATION, FOOT, TRANSMETATARSAL
Anesthesia: Monitor Anesthesia Care | Site: Toe | Laterality: Left

## 2019-11-19 MED ORDER — MIDAZOLAM HCL 2 MG/2ML IJ SOLN
INTRAMUSCULAR | Status: AC
  Filled 2019-11-19: qty 2

## 2019-11-19 MED ORDER — BUPIVACAINE HCL (PF) 0.5 % IJ SOLN
INTRAMUSCULAR | Status: DC | PRN
  Administered 2019-11-19: 10 mL

## 2019-11-19 MED ORDER — MUPIROCIN 2 % EX OINT
1.0000 "application " | TOPICAL_OINTMENT | Freq: Two times a day (BID) | CUTANEOUS | Status: DC
  Administered 2019-11-19 – 2019-11-21 (×5): 1 via NASAL
  Filled 2019-11-19 (×3): qty 22

## 2019-11-19 MED ORDER — FENTANYL CITRATE (PF) 100 MCG/2ML IJ SOLN
INTRAMUSCULAR | Status: DC | PRN
  Administered 2019-11-19: 50 ug via INTRAVENOUS
  Administered 2019-11-19: 25 ug via INTRAVENOUS

## 2019-11-19 MED ORDER — LIDOCAINE HCL 2 % IJ SOLN
INTRAMUSCULAR | Status: DC | PRN
  Administered 2019-11-19: 10 mL

## 2019-11-19 MED ORDER — DEXTROSE 50 % IV SOLN
INTRAVENOUS | Status: AC
  Administered 2019-11-19: 16:00:00 50 mL via INTRAVENOUS
  Filled 2019-11-19: qty 50

## 2019-11-19 MED ORDER — PHENYLEPHRINE HCL (PRESSORS) 10 MG/ML IV SOLN
INTRAVENOUS | Status: DC | PRN
  Administered 2019-11-19 (×2): 120 ug via INTRAVENOUS
  Administered 2019-11-19: 200 ug via INTRAVENOUS

## 2019-11-19 MED ORDER — PHENYLEPHRINE 40 MCG/ML (10ML) SYRINGE FOR IV PUSH (FOR BLOOD PRESSURE SUPPORT)
PREFILLED_SYRINGE | INTRAVENOUS | Status: AC
  Filled 2019-11-19: qty 10

## 2019-11-19 MED ORDER — BUPIVACAINE HCL (PF) 0.5 % IJ SOLN
INTRAMUSCULAR | Status: AC
  Filled 2019-11-19: qty 30

## 2019-11-19 MED ORDER — VANCOMYCIN HCL IN DEXTROSE 1-5 GM/200ML-% IV SOLN
1000.0000 mg | INTRAVENOUS | Status: DC
  Administered 2019-11-20 – 2019-11-21 (×2): 1000 mg via INTRAVENOUS
  Filled 2019-11-19 (×2): qty 200

## 2019-11-19 MED ORDER — DEXTROSE 50 % IV SOLN
INTRAVENOUS | Status: AC
  Filled 2019-11-19: qty 50

## 2019-11-19 MED ORDER — ONDANSETRON HCL 4 MG/2ML IJ SOLN
INTRAMUSCULAR | Status: AC
  Filled 2019-11-19: qty 2

## 2019-11-19 MED ORDER — DEXTROSE 50 % IV SOLN
INTRAVENOUS | Status: DC | PRN
  Administered 2019-11-19: 25 mL via INTRAVENOUS

## 2019-11-19 MED ORDER — SODIUM CHLORIDE 0.9% FLUSH: 10.0000 mL | INTRAVENOUS | Status: DC | PRN

## 2019-11-19 MED ORDER — DEXAMETHASONE SODIUM PHOSPHATE 10 MG/ML IJ SOLN
INTRAMUSCULAR | Status: DC | PRN
  Administered 2019-11-19: 4 mg via INTRAVENOUS

## 2019-11-19 MED ORDER — SODIUM CHLORIDE 0.9 % IV SOLN: INTRAVENOUS | Status: DC

## 2019-11-19 MED ORDER — DEXTROSE 50 % IV SOLN: 1.0000 | Freq: Once | INTRAVENOUS | Status: AC

## 2019-11-19 MED ORDER — ONDANSETRON HCL 4 MG/2ML IJ SOLN
INTRAMUSCULAR | Status: DC | PRN
  Administered 2019-11-19: 4 mg via INTRAVENOUS

## 2019-11-19 MED ORDER — FENTANYL CITRATE (PF) 250 MCG/5ML IJ SOLN
INTRAMUSCULAR | Status: AC
  Filled 2019-11-19: qty 5

## 2019-11-19 MED ORDER — PROPOFOL 10 MG/ML IV BOLUS
INTRAVENOUS | Status: DC | PRN
  Administered 2019-11-19: 150 mg via INTRAVENOUS

## 2019-11-19 MED ORDER — MIDAZOLAM HCL 5 MG/5ML IJ SOLN
INTRAMUSCULAR | Status: DC | PRN
  Administered 2019-11-19: 2 mg via INTRAVENOUS

## 2019-11-19 MED ORDER — LACTATED RINGERS IV SOLN: INTRAVENOUS | Status: DC | PRN

## 2019-11-19 MED ORDER — LIDOCAINE HCL 2 % IJ SOLN
INTRAMUSCULAR | Status: AC
  Filled 2019-11-19: qty 20

## 2019-11-19 MED ORDER — FENTANYL CITRATE (PF) 100 MCG/2ML IJ SOLN
INTRAMUSCULAR | Status: AC
  Filled 2019-11-19: qty 2

## 2019-11-19 MED ORDER — PHENYLEPHRINE HCL-NACL 10-0.9 MG/250ML-% IV SOLN
INTRAVENOUS | Status: DC | PRN
  Administered 2019-11-19: 25 ug/min via INTRAVENOUS

## 2019-11-19 MED ORDER — LIDOCAINE-EPINEPHRINE 2 %-1:100000 IJ SOLN
INTRAMUSCULAR | Status: AC
  Filled 2019-11-19: qty 1

## 2019-11-19 MED ORDER — 0.9 % SODIUM CHLORIDE (POUR BTL) OPTIME
TOPICAL | Status: DC | PRN
  Administered 2019-11-19: 1000 mL

## 2019-11-19 SURGICAL SUPPLY — 48 items
APL PRP STRL LF DISP 70% ISPRP (MISCELLANEOUS)
APL SKNCLS STERI-STRIP NONHPOA (GAUZE/BANDAGES/DRESSINGS) ×1
BENZOIN TINCTURE PRP APPL 2/3 (GAUZE/BANDAGES/DRESSINGS) ×2 IMPLANT
BLADE AVERAGE 25X9 (BLADE) IMPLANT
BLADE OSCILLATING /SAGITTAL (BLADE) ×2 IMPLANT
BLADE SURG 15 STRL LF DISP TIS (BLADE) IMPLANT
BLADE SURG 15 STRL SS (BLADE)
BNDG ELASTIC 3X5.8 VLCR STR LF (GAUZE/BANDAGES/DRESSINGS) ×1 IMPLANT
BNDG ELASTIC 4X5.8 VLCR STR LF (GAUZE/BANDAGES/DRESSINGS) IMPLANT
BNDG GAUZE ELAST 4 BULKY (GAUZE/BANDAGES/DRESSINGS) ×2 IMPLANT
CHLORAPREP W/TINT 26 (MISCELLANEOUS) IMPLANT
COVER SURGICAL LIGHT HANDLE (MISCELLANEOUS) ×2 IMPLANT
COVER WAND RF STERILE (DRAPES) ×2 IMPLANT
CUFF TOURN SGL QUICK 18X4 (TOURNIQUET CUFF) ×2 IMPLANT
CUFF TOURN SGL QUICK 24 (TOURNIQUET CUFF)
CUFF TRNQT CYL 24X4X16.5-23 (TOURNIQUET CUFF) IMPLANT
DRSG PAD ABDOMINAL 8X10 ST (GAUZE/BANDAGES/DRESSINGS) ×3 IMPLANT
ELECT REM PT RETURN 9FT ADLT (ELECTROSURGICAL)
ELECTRODE REM PT RTRN 9FT ADLT (ELECTROSURGICAL) IMPLANT
GAUZE PACKING IODOFORM 1/4X15 (GAUZE/BANDAGES/DRESSINGS) ×2 IMPLANT
GAUZE SPONGE 4X4 12PLY STRL (GAUZE/BANDAGES/DRESSINGS) ×3 IMPLANT
GAUZE XEROFORM 1X8 LF (GAUZE/BANDAGES/DRESSINGS) ×2 IMPLANT
GLOVE BIO SURGEON STRL SZ8 (GLOVE) ×2 IMPLANT
GLOVE BIOGEL PI IND STRL 8 (GLOVE) ×1 IMPLANT
GLOVE BIOGEL PI INDICATOR 8 (GLOVE) ×1
GOWN STRL REUS W/ TWL LRG LVL3 (GOWN DISPOSABLE) ×2 IMPLANT
GOWN STRL REUS W/TWL LRG LVL3 (GOWN DISPOSABLE) ×4
KIT BASIN OR (CUSTOM PROCEDURE TRAY) ×2 IMPLANT
KIT TURNOVER KIT B (KITS) ×2 IMPLANT
NDL PRECISIONGLIDE 27X1.5 (NEEDLE) ×1 IMPLANT
NEEDLE PRECISIONGLIDE 27X1.5 (NEEDLE) ×2 IMPLANT
NS IRRIG 1000ML POUR BTL (IV SOLUTION) ×2 IMPLANT
PACK ORTHO EXTREMITY (CUSTOM PROCEDURE TRAY) ×2 IMPLANT
PAD ARMBOARD 7.5X6 YLW CONV (MISCELLANEOUS) ×4 IMPLANT
PAD CAST 4YDX4 CTTN HI CHSV (CAST SUPPLIES) IMPLANT
PADDING CAST COTTON 4X4 STRL (CAST SUPPLIES)
SOL PREP POV-IOD 4OZ 10% (MISCELLANEOUS) ×2 IMPLANT
STAPLER VISISTAT 35W (STAPLE) IMPLANT
STRIP CLOSURE SKIN 1/2X4 (GAUZE/BANDAGES/DRESSINGS) ×2 IMPLANT
SUT MON AB 3-0 SH 27 (SUTURE) ×2
SUT MON AB 3-0 SH27 (SUTURE) IMPLANT
SUT PROLENE 4 0 PS 2 18 (SUTURE) IMPLANT
SUT VIC AB 3-0 PS2 18 (SUTURE) IMPLANT
SUT VICRYL 4-0 PS2 18IN ABS (SUTURE) ×2 IMPLANT
SYR CONTROL 10ML LL (SYRINGE) ×4 IMPLANT
TOWEL GREEN STERILE (TOWEL DISPOSABLE) ×2 IMPLANT
TUBE CONNECTING 12X1/4 (SUCTIONS) ×2 IMPLANT
YANKAUER SUCT BULB TIP NO VENT (SUCTIONS) IMPLANT

## 2019-11-19 NOTE — Anesthesia Preprocedure Evaluation (Addendum)
Anesthesia Evaluation  Patient identified by MRN, date of birth, ID band Patient awake    Reviewed: Allergy & Precautions, NPO status , Patient's Chart, lab work & pertinent test results  Airway Mallampati: II  TM Distance: >3 FB Neck ROM: Full    Dental  (+) Dental Advisory Given   Pulmonary sleep apnea , Current Smoker,    breath sounds clear to auscultation       Cardiovascular hypertension, Pt. on medications  Rhythm:Regular Rate:Normal     Neuro/Psych  Neuromuscular disease    GI/Hepatic Neg liver ROS, GERD  ,  Endo/Other  diabetes, Type 2  Renal/GU CRFRenal disease     Musculoskeletal   Abdominal   Peds  Hematology  (+) anemia ,   Anesthesia Other Findings   Reproductive/Obstetrics                             Lab Results  Component Value Date   WBC 12.4 (H) 11/19/2019   HGB 11.0 (L) 11/19/2019   HCT 35.1 (L) 11/19/2019   MCV 73.1 (L) 11/19/2019   PLT 432 (H) 11/19/2019   Lab Results  Component Value Date   CREATININE 2.44 (H) 11/19/2019   BUN 36 (H) 11/19/2019   NA 128 (L) 11/19/2019   K 4.8 11/19/2019   CL 94 (L) 11/19/2019   CO2 22 11/19/2019    Anesthesia Physical Anesthesia Plan  ASA: III  Anesthesia Plan: MAC   Post-op Pain Management:    Induction: Intravenous  PONV Risk Score and Plan: 1 and Propofol infusion, Ondansetron and Treatment may vary due to age or medical condition  Airway Management Planned: Natural Airway and Simple Face Mask  Additional Equipment: None  Intra-op Plan:   Post-operative Plan:   Informed Consent: I have reviewed the patients History and Physical, chart, labs and discussed the procedure including the risks, benefits and alternatives for the proposed anesthesia with the patient or authorized representative who has indicated his/her understanding and acceptance.     Dental advisory given  Plan Discussed with:  CRNA  Anesthesia Plan Comments:        Anesthesia Quick Evaluation

## 2019-11-19 NOTE — Brief Op Note (Signed)
11/19/2019  5:34 PM  PATIENT:  Anthony Flowers  59 y.o. male  PRE-OPERATIVE DIAGNOSIS:  infection  POST-OPERATIVE DIAGNOSIS:  infection  PROCEDURE:  Procedure(s): TRANSMETATARSAL AMPUTATION (Left)  SURGEON:  Surgeon(s) and Role:    * Trula Slade, DPM - Primary  PHYSICIAN ASSISTANT:   ASSISTANTS: none   ANESTHESIA:   general  EBL:  250 mL   BLOOD ADMINISTERED:none  DRAINS: none   LOCAL MEDICATIONS USED:  OTHER 20 cc lidocaine and marcaine plain   SPECIMEN:  Source of Specimen:  wound culture left foot  DISPOSITION OF SPECIMEN:  PATHOLOGY  COUNTS:  YES  TOURNIQUET:  * No tourniquets in log *  DICTATION: .Dragon Dictation  PLAN OF CARE: Admit to inpatient   PATIENT DISPOSITION:  PACU - hemodynamically stable.   Delay start of Pharmacological VTE agent (>24hrs) due to surgical blood loss or risk of bleeding: no

## 2019-11-19 NOTE — Progress Notes (Signed)
Pharmacy Antibiotic Note  Anthony Flowers is a 59 y.o. male admitted on 11/17/2019 with wound infection.  Pharmacy has been consulted for vancomycin/cefepime dosing. Flagyl also ordered per MD. Noted "itching, swelling, rash" allergy to PCN with no history of beta lactam usage in chart. Per discussion with Dr. Roosevelt Locks, changed Zosyn to cefepime and Flagyl and will monitor. Patient recently s/p I&D x2 of L foot stump and placed on doxy PTA. AKI - SCr 2.94 on admit (most recent SCr was 2.02 from 05/2018).  Today's Scr  2.44 and is trending down.  Plan: Continue cefepime 2g IV q12h Increase vancomycin from 750mg  IV q24h to 1g IV q24h Goal AUC 400-550. Expected AUC: 550 but expect lower number with improved renal function SCr used: 2.44 Monitor clinical progress, c/s, renal function F/u de-escalation plan/LOT, vancomycin levels as indicated   Height: 6' (182.9 cm) Weight: 235 lb 0.2 oz (106.6 kg) IBW/kg (Calculated) : 77.6  Temp (24hrs), Avg:98.6 F (37 C), Min:97.8 F (36.6 C), Max:99.9 F (37.7 C)  Recent Labs  Lab 11/17/19 1226 11/17/19 1605 11/18/19 0629 11/19/19 0553  WBC 14.7*  --  12.7* 12.4*  CREATININE 2.94*  --  2.70* 2.44*  LATICACIDVEN 1.5 1.7  --   --     Estimated Creatinine Clearance: 41.1 mL/min (A) (by C-G formula based on SCr of 2.44 mg/dL (H)).    Allergies  Allergen Reactions  . Latex Hives  . Tape Hives  . Chlorhexidine Itching and Rash    Develops skin irritation   . Fish Allergy Hives and Rash    Patient unsure which fish  . Penicillins Itching, Swelling and Rash    Did it involve swelling of the face/tongue/throat, SOB, or low BP? Yes Did it involve sudden or severe rash/hives, skin peeling, or any reaction on the inside of your mouth or nose? No Did you need to seek medical attention at a hospital or doctor's office? Yes When did it last happen?59-30 years old If all above answers are "NO", may proceed with cephalosporin use.      Antimicrobials this admission: 3/29 vancomycin >>  3/29 cefepime >>   Dose adjustments this admission: 3/31 Change vancomycin from 750mg  IV q24h to 1g IV q24h  Microbiology results: 3/30 blood: NGTD  Shela Commons, PharmD, BCPS Please check AMION for all [place] contact numbers Clinical Pharmacist 11/19/2019 2:04 PM

## 2019-11-19 NOTE — Transfer of Care (Signed)
Immediate Anesthesia Transfer of Care Note  Patient: Anthony Flowers  Procedure(s) Performed: TRANSMETATARSAL AMPUTATION (Left Toe)  Patient Location: PACU  Anesthesia Type:General  Level of Consciousness: awake and alert   Airway & Oxygen Therapy: Patient Spontanous Breathing and Patient connected to nasal cannula oxygen  Post-op Assessment: Report given to RN and Post -op Vital signs reviewed and stable  Post vital signs: Reviewed and stable  Last Vitals:  Vitals Value Taken Time  BP 110/63 11/19/19 1738  Temp    Pulse 70 11/19/19 1740  Resp 18 11/19/19 1740  SpO2 95 % 11/19/19 1740  Vitals shown include unvalidated device data.  Last Pain:  Vitals:   11/19/19 1348  TempSrc: Oral  PainSc:       Patients Stated Pain Goal: 3 (35/67/01 4103)  Complications: No apparent anesthesia complications

## 2019-11-19 NOTE — Op Note (Signed)
11/19/2019  5:34 PM  PATIENT:  Anthony Flowers  59 y.o. male  PRE-OPERATIVE DIAGNOSIS:  infection  POST-OPERATIVE DIAGNOSIS:  infection  PROCEDURE:  Procedure(s): TRANSMETATARSAL AMPUTATION (Left)  SURGEON:  Surgeon(s) and Role:    * Trula Slade, DPM - Primary  PHYSICIAN ASSISTANT:   ASSISTANTS: none   ANESTHESIA:   general  EBL:  250 mL   BLOOD ADMINISTERED:none  DRAINS: none   LOCAL MEDICATIONS USED:  OTHER 20 cc lidocaine and marcaine plain   SPECIMEN:  Source of Specimen:  wound culture left foot  DISPOSITION OF SPECIMEN:  PATHOLOGY  COUNTS:  YES  TOURNIQUET:  * No tourniquets in log *  DICTATION: .Dragon Dictation  PLAN OF CARE: Admit to inpatient   PATIENT DISPOSITION:  PACU - hemodynamically stable.   Delay start of Pharmacological VTE agent (>24hrs) due to surgical blood loss or risk of bleeding: no  Indications for surgery: 59 year old male developed initially a blister to the bottom of his left foot submetatarsal area which subsequently developed into an ulceration.  He had worsening of the wound and despite oral antibiotics infection progressing is admitted to the hospital.  MRI was concerning for osteomyelitis as well as abscess.  He was started IV antibiotics and overall cellulitis improved however prior to surgery there is noticed to be purulence coming from the wound.  Given the infection discussed surgical invention.  Discussed pros and cons of surgery including alternatives, risks, complications.  No promises or guarantees any aspect of the procedure.  We discussed limb salvage versus amputation.  After long discussion he like to proceed with transmetatarsal amputation.  Procedure in detail: The patient was both verbally and visually identified by myself, the nursing staff, the anesthesia staff preoperatively.  He was then transferred the operating room via stretcher and placed on the operating table in supine position.  After LMA was placed  the left lower extremities and scrubbed prepped and draped in the normal sterile fashion.  Appropriate timeout was performed.  Next an incision was planned at the distal aspect of the foot for the transmetatarsal amputation and the incision was planned just proximal to the area of the wound and infection.  Incision was made initially with a #15 with scalpel through the skin.  Bovie as well as a 15 blade scalpel was utilized to dissect down to the metatarsals.  Once the metatarsals were all identified and elevator was utilized to remove the soft tissue.  A sagittal saw was utilized to resect metatarsals 1 through 5 and fluoroscopy was utilized to confirm adequate resection and parabola.  Next the plantar flap was then completed disarticulating the forefoot.  In the forefoot that was disarticulated there is found to be quite a bit of purulence.  At this time I debrided all nonviable tissue with a rongeur as well as a 15 blade scalpel.  There is no further signs of purulence and the tissue was bleeding appeared to be healthy.  There is no proximal tracking identified.  The remaining bone appeared to be viable without any signs of infection that was noticeable.  The wound culture was obtained.  The incision was copiously irrigated with saline and hemostasis was achieved.  Incision was then closed with Prolene as well as skin staples leaving the central area open in order to drain.  This wound the wound was packed with the corner of a 4 x 4 saline moistened gauze and a dry sterile dressing was applied.  He was awoken anesthesia and  found to tolerate the procedure without any complications.  He is remained inpatient for IV antibiotics until his Friday with close monitoring.

## 2019-11-19 NOTE — Progress Notes (Signed)
Patient was seen in pre-op holding. We went over the surgical plan. He states that him and his wife are wanting to proceed with the TMA but his other family members want to use this as a last resort. We discussed options for surgery including proceeding with the TMA as planned versus I&D, bone biopsy and long term antibiotics. After a long discussion and going over the x-rays and MRI with him he wants to proceed with the TMA. Also on exam today there is purulence from the wound. There is localized welling to the plantar wound and to the lateral forefoot and it does appear to be an abscess distally today. WB today is 12.4 but currently afebrile. He has arterial studies ordered but he does have palpable pulses. Surgical consent form signed. No further questions or concerns.   Celesta Gentile, DPM O: [phone] C: [phone]

## 2019-11-19 NOTE — Progress Notes (Signed)
Inpatient Diabetes Program Recommendations  AACE/ADA: New Consensus Statement on Inpatient Glycemic Control   Target Ranges:  Prepandial:   less than 140 mg/dL      Peak postprandial:   less than 180 mg/dL (1-2 hours)      Critically ill patients:  140 - 180 mg/dL  Results for Anthony Flowers, Anthony Flowers (MRN [id number]) as of 11/19/2019 10:21  Ref. Range 11/19/2019 00:59 11/19/2019 06:42  Glucose-Capillary Latest Ref Range: 70 - 99 mg/dL 225 (H)   Lantus 20 units 320 (H)  Novolog 21 units   Results for Anthony Flowers, Anthony Flowers (MRN [id number]) as of 11/19/2019 10:21  Ref. Range 11/18/2019 06:31 11/18/2019 11:28 11/18/2019 15:11 11/18/2019 16:40 11/18/2019 20:56 11/18/2019 22:40  Glucose-Capillary Latest Ref Range: 70 - 99 mg/dL 134 (H) 173 (H)  Novolog 3 units 411 (H)  Novolog 20 units @ 15:41 381 (H)  Novolog 20 units @17 :59  Novolog 6 units@18 :00 38 (LL) 82  Results for Anthony Flowers, Anthony Flowers (MRN [id number]) as of 11/19/2019 10:21  Ref. Range 11/17/2019 18:22  Hemoglobin A1C Latest Ref Range: 4.8 - 5.6 % 8.3 (H)   Review of Glycemic Control  Diabetes history: DM2 Outpatient Diabetes medications: Lantus 42 units QHS, Novolog 6-7 units TID with meals Current orders for Inpatient glycemic control: Lantus 20 units QHS, Novolog 6 units TID with meals, Novolog 0-20 units TID with meals, Novolog 0-5 units QHS  Inpatient Diabetes Program Recommendations:   Correction (SSI): Patient received Novolog 20 units at 15:41 and another Novolog 20 units at 17:59 and Novolog 6 units at 18:00 on 11/18/19 which lead to glucose of 38 mg/dl at 20:56 on 11/18/19.  NOTE: Patient is currently NPO. Noted glucose went low yesterday after getting 2 large dosages of Novolog within about 2 1/2 hours apart. Glucose 225 mg/dl at 00:59 on 11/19/19 (question if hyperglycemic due to over treatment of hypoglycemia) and glucose up to 320 mg/dl this morning. Will continue to follow and make further recommendations if needed.  Thanks, Barnie Alderman,  RN, MSN, CDE Diabetes Coordinator Inpatient Diabetes Program [phone] (Team Pager from 8am to 5pm)

## 2019-11-19 NOTE — Anesthesia Procedure Notes (Signed)
Procedure Name: LMA Insertion Date/Time: 11/19/2019 4:18 PM Performed by: Inda Coke, CRNA Pre-anesthesia Checklist: Patient identified, Emergency Drugs available, Suction available and Patient being monitored Patient Re-evaluated:Patient Re-evaluated prior to induction Oxygen Delivery Method: Circle System Utilized Preoxygenation: Pre-oxygenation with 100% oxygen Induction Type: IV induction LMA: LMA inserted LMA Size: 5.0 Number of attempts: 1 Placement Confirmation: positive ETCO2 Tube secured with: Tape Dental Injury: Teeth and Oropharynx as per pre-operative assessment

## 2019-11-19 NOTE — Plan of Care (Signed)
  Problem: Clinical Measurements: Goal: Will remain free from infection Outcome: Progressing  Patient afebrile this shift.  Problem: Activity: Goal: Risk for activity intolerance will decrease Outcome: Progressing  Patient denies SOB when performing ADL's.  Problem: Safety: Goal: Ability to remain free from injury will improve Outcome: Progressing  Patient calls for help before getting up.  Problem: Skin Integrity: Goal: Risk for impaired skin integrity will decrease Outcome: Progressing  No new areas of skin breakdown noted.

## 2019-11-19 NOTE — Progress Notes (Signed)
Blood sugar 88

## 2019-11-19 NOTE — Progress Notes (Signed)
Orthopedic Tech Progress Note Patient Details:  Anthony Flowers [birth year] [id number] Went to apply boot to patient foot. With dressing on the boot is a little tight. So I told the patient tomorrow once the MD comes and take a look at your foot we can apply the boot. Sat it in the room on the counter and notified both RNs. Ortho Devices Type of Ortho Device: CAM walker Ortho Device/Splint Location: LLE Ortho Device/Splint Interventions: Ordered, Application   Post Interventions Patient Tolerated: Other (comment) Instructions Provided: [facility] [place] 11/19/2019, 7:11 PM

## 2019-11-19 NOTE — Progress Notes (Signed)
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 Patient was seen by podiatrist about 3 days ago, another debridement (office)was done and recommendation was to continue antibiotics. Over last 2 days patient started to have increasing left foot pain, swelling and rash toward the mid shin level of the left side, with fever with T-max 103.2 last ight.n  Subjective / 24h Interval events: Doing well this morning, awaiting surgery.  No fever or chills.  Assessment & Plan: Principal Problem Diabetic Foot Infection  Osteomyelitis -Wound cx from 3/25 with staph aureus and strep agalactiae (prelim - sensitivities for staph, not resulted) -MRI 3/29 with ill defined fluid collections adjacent to 3rd and 4th metatarsal heads suspicious for abscess -failed outpatient ABX and surgery treatment -Sepsis evidenced by fever and elevated white count and evidence of infection  -Continue vanc/cefepime/flagyl -Dr. Earleen Newport with podiatry consulted, plan for surgery today.  Continue to monitor cultures as well as antibiotics, based on operative report may not need prolonged antibiotics  Active problems AKI on CKD stage II -Hold his ARB and HCTZ, UA is bland, with baseline creatinine appears to be around 2, currently at 2.4, appears to be improving with fluids  HTN -Continuebeta-blocker and amlodipine -Hold ARB and HCTZ , blood pressure acceptable today  IDDM -Lantus plus sliding scale for now, had a hypoglycemic episode yesterday, keep on same regimen, currently npo  CBG (last  3)  Recent Labs    11/18/19 2240 11/19/19 0059 11/19/19 0642  GLUCAP 82 225* 320*   HLD -On statin   Scheduled Meds: . acetaminophen  1,000 mg Oral Q8H  . amLODipine  10 mg Oral Daily  . aspirin EC  81 mg Oral Daily  . atorvastatin  20 mg Oral Daily  . carvedilol  6.25 mg Oral BID  . insulin aspart  0-20 Units Subcutaneous TID WC  . insulin aspart  0-5 Units Subcutaneous QHS  . insulin aspart  6 Units Subcutaneous TID WC  . insulin glargine  20 Units Subcutaneous QHS  . mupirocin ointment  1 application Nasal BID  . pantoprazole  40 mg Oral Daily  . sodium chloride flush  3 mL Intravenous Once   Continuous Infusions: . ceFEPime (MAXIPIME) IV 2 g (11/19/19 0839)  . lactated ringers Stopped (11/19/19 0545)  . metronidazole Stopped (11/19/19 0515)  . vancomycin 750 mg (11/19/19 0650)   PRN Meds:.HYDROmorphone (DILAUDID) injection, oxyCODONE **OR** oxyCODONE, sodium chloride flush  DVT prophylaxis: SCDs Code Status: Full code Family Communication: Discussed with patient Patient admitted from: Home Anticipated d/c place: Home Barriers to d/c: Undergoing surgery today  Consultants:  Podiatry   Procedures:  None   Microbiology  No growth  Antimicrobials: Vancomycin, cefepime, metronidazole 3/30 >>   Objective: Vitals:   11/18/19 1416 11/18/19 2027 11/19/19 0403 11/19/19 0753  BP: 118/69 118/67 135/74 129/71  Pulse: 80 89 71 74  Resp: 18 16 18 16   Temp: 98.1 F (36.7 C) 99.9 F (37.7 C) 98.3 F (36.8 C) 98.7 F (37.1 C)  TempSrc: Oral Oral Oral Oral  SpO2: 93% 100% 97% 98%  Weight:  Height:        Intake/Output Summary (Last 24 hours) at 11/19/2019 1203 Last data filed at 11/19/2019 1132 Gross per 24 hour  Intake 3564.78 ml  Output 1450 ml  Net 2114.78 ml   Filed Weights   11/17/19 1215 11/17/19 1700  Weight: 106.6 kg 106.6 kg    Examination:  Constitutional: NAD Eyes: no scleral icterus ENMT: Mucous membranes are moist.  Neck: normal,  supple Respiratory: clear to auscultation bilaterally, no wheezing, no crackles.  Cardiovascular: Regular rate and rhythm, no murmurs / rubs / gallops. No LE edema.  Abdomen: non distended, no tenderness. Bowel sounds positive.  Musculoskeletal: no clubbing / cyanosis.  Skin: no rashes Neurologic: non focal   Data Reviewed: I have independently reviewed following labs and imaging studies   CBC: Recent Labs  Lab 11/17/19 1226 11/18/19 0629 11/19/19 0553  WBC 14.7* 12.7* 12.4*  NEUTROABS 10.2*  --  8.0*  HGB 12.9* 11.3* 11.0*  HCT 41.4 36.2* 35.1*  MCV 73.7* 72.4* 73.1*  PLT 441* 345 326*   Basic Metabolic Panel: Recent Labs  Lab 11/17/19 1226 11/18/19 0629 11/19/19 0553  NA 132* 130* 128*  K 4.8 4.4 4.8  CL 96* 95* 94*  CO2 25 21* 22  GLUCOSE 195* 125* 303*  BUN 37* 43* 36*  CREATININE 2.94* 2.70* 2.44*  CALCIUM 10.1 9.3 9.6  MG  --   --  1.5*  PHOS  --   --  3.3   Liver Function Tests: Recent Labs  Lab 11/17/19 1226 11/19/19 0553  AST 36 30  ALT 30 24  ALKPHOS 100 100  BILITOT 1.0 1.0  PROT 7.9 6.9  ALBUMIN 3.1* 2.5*   Coagulation Profile: No results for input(s): INR, PROTIME in the last 168 hours. HbA1C: Recent Labs    11/17/19 1822  HGBA1C 8.3*   CBG: Recent Labs  Lab 11/18/19 1640 11/18/19 2056 11/18/19 2240 11/19/19 0059 11/19/19 0642  GLUCAP 381* 38* 82 225* 320*    Recent Results (from the past 240 hour(s))  WOUND CULTURE     Status: Abnormal (Preliminary result)   Collection Time: 11/13/19  5:12 PM   Specimen: Foot, Left; Wound  Result Value Ref Range Status   MICRO NUMBER: [id number]  Preliminary   SPECIMEN QUALITY: Adequate  Preliminary   SOURCE: WOUND (SITE NOT SPECIFIED)  Preliminary   STATUS: PRELIMINARY  Preliminary   GRAM STAIN:   Preliminary    No white blood cells seen No epithelial cells seen Few Gram positive cocci in pairs   ISOLATE 1: Staphylococcus aureus (A)  Preliminary    Comment: Moderate growth of  Staphylococcus aureus   ISOLATE 2: Streptococcus agalactiae (A)  Preliminary    Comment: Heavy growth of Group B Streptococcus isolated Beta-hemolytic streptococci are predictably susceptible to Penicillin and other beta-lactams. Susceptibility testing not routinely performed. Please contact the laboratory within 3 days if susceptibility  testing is desired.   SARS CORONAVIRUS 2 (TAT 6-24 HRS) Nasopharyngeal Nasopharyngeal Swab     Status: None   Collection Time: 11/17/19  4:09 PM   Specimen: Nasopharyngeal Swab  Result Value Ref Range Status   SARS Coronavirus 2 NEGATIVE NEGATIVE Final    Comment: (NOTE) SARS-CoV-2 target nucleic acids are NOT DETECTED. The SARS-CoV-2 RNA is generally detectable in upper and lower respiratory specimens during the acute phase of infection. Negative results do not preclude SARS-CoV-2 infection, do not rule out co-infections with other pathogens, and should not be used as the sole basis for  treatment or other patient management decisions. Negative results must be combined with clinical observations, patient history, and epidemiological information. The expected result is Negative. Fact Sheet for Patients: SugarRoll.be Fact Sheet for Healthcare Providers: https://www.woods-mathews.com/ This test is not yet approved or cleared by the [place] FDA and  has been authorized for detection and/or diagnosis of SARS-CoV-2 by FDA under an Emergency Use Authorization (EUA). This EUA will remain  in effect (meaning this test can be used) for the duration of the COVID-19 declaration under Section 56 4(b)(1) of the Act, 21 U.S.C. section 360bbb-3(b)(1), unless the authorization is terminated or revoked sooner. Performed at [facility], [place] [address] [place], [place] [address]   Culture, blood (routine x 2)     Status: None (Preliminary result)   Collection Time: 11/18/19  1:40 PM   Specimen: BLOOD  Result  Value Ref Range Status   Specimen Description BLOOD LEFT ANTECUBITAL  Final   Special Requests AEROBIC BOTTLE ONLY Blood Culture adequate volume  Final   Culture   Final    NO GROWTH <12 HOURS Performed at [facility], [place] [address], [place], [place] [address]    Report Status PENDING  Incomplete  Culture, blood (routine x 2)     Status: None (Preliminary result)   Collection Time: 11/18/19  1:45 PM   Specimen: BLOOD  Result Value Ref Range Status   Specimen Description BLOOD LEFT ANTECUBITAL  Final   Special Requests AEROBIC BOTTLE ONLY Blood Culture adequate volume  Final   Culture   Final    NO GROWTH <12 HOURS Performed at [facility], [place] [address] [place], [place] [address]    Report Status PENDING  Incomplete  Surgical PCR screen     Status: None   Collection Time: 11/19/19  4:06 AM   Specimen: Nasal Mucosa; Nasal Swab  Result Value Ref Range Status   MRSA, PCR NEGATIVE NEGATIVE Final   Staphylococcus aureus NEGATIVE NEGATIVE Final    Comment: (NOTE) The Xpert SA Assay (FDA approved for NASAL specimens in patients 27 years of age and older), is one component of a comprehensive surveillance program. It is not intended to diagnose infection nor to guide or monitor treatment. Performed at [facility], [place] [address], [place], [place] [address]      Radiology Studies: No results found.  Marzetta Board, MD, PhD [facility]  Between 7 am - 7 pm I am available, please contact me via Amion or Securechat  Between 7 pm - 7 am I am not available, please contact night coverage MD/APP via Amion

## 2019-11-20 ENCOUNTER — Ambulatory Visit: Payer: Medicare HMO | Admitting: Podiatry

## 2019-11-20 ENCOUNTER — Encounter (HOSPITAL_COMMUNITY): Payer: Medicare HMO

## 2019-11-20 ENCOUNTER — Encounter: Payer: Self-pay | Admitting: *Deleted

## 2019-11-20 LAB — CBC
HCT: 32.8 % — ABNORMAL LOW (ref 39.0–52.0)
Hemoglobin: 10.2 g/dL — ABNORMAL LOW (ref 13.0–17.0)
MCH: 22.9 pg — ABNORMAL LOW (ref 26.0–34.0)
MCHC: 31.1 g/dL (ref 30.0–36.0)
MCV: 73.5 fL — ABNORMAL LOW (ref 80.0–100.0)
Platelets: 487 K/uL — ABNORMAL HIGH (ref 150–400)
RBC: 4.46 MIL/uL (ref 4.22–5.81)
RDW: 14 % (ref 11.5–15.5)
WBC: 14.4 K/uL — ABNORMAL HIGH (ref 4.0–10.5)
nRBC: 0 % (ref 0.0–0.2)

## 2019-11-20 LAB — BASIC METABOLIC PANEL WITH GFR
Anion gap: 11 (ref 5–15)
BUN: 34 mg/dL — ABNORMAL HIGH (ref 6–20)
CO2: 22 mmol/L (ref 22–32)
Calcium: 9.2 mg/dL (ref 8.9–10.3)
Chloride: 101 mmol/L (ref 98–111)
Creatinine, Ser: 2.15 mg/dL — ABNORMAL HIGH (ref 0.61–1.24)
GFR calc Af Amer: 38 mL/min — ABNORMAL LOW (ref >60)
GFR calc non Af Amer: 33 mL/min — ABNORMAL LOW (ref >60)
Glucose, Bld: 247 mg/dL — ABNORMAL HIGH (ref 70–99)
Potassium: 5.3 mmol/L — ABNORMAL HIGH (ref 3.5–5.1)
Sodium: 134 mmol/L — ABNORMAL LOW (ref 135–145)

## 2019-11-20 LAB — GLUCOSE, CAPILLARY
Glucose-Capillary: 228 mg/dL — ABNORMAL HIGH (ref 70–99)
Glucose-Capillary: 228 mg/dL — ABNORMAL HIGH (ref 70–99)
Glucose-Capillary: 153 mg/dL — ABNORMAL HIGH (ref 70–99)
Glucose-Capillary: 252 mg/dL — ABNORMAL HIGH (ref 70–99)

## 2019-11-20 MED ORDER — ONDANSETRON HCL 4 MG/2ML IJ SOLN
INTRAMUSCULAR | Status: AC
  Administered 2019-11-20: 08:00:00 4 mg
  Filled 2019-11-20: qty 2

## 2019-11-20 MED ORDER — ONDANSETRON HCL 4 MG/2ML IJ SOLN
4.0000 mg | Freq: Four times a day (QID) | INTRAMUSCULAR | Status: DC | PRN
  Administered 2019-11-20: 4 mg via INTRAVENOUS
  Filled 2019-11-20: qty 2

## 2019-11-20 MED ORDER — INSULIN GLARGINE 100 UNIT/ML ~~LOC~~ SOLN
35.0000 [IU] | Freq: Every day | SUBCUTANEOUS | Status: DC
  Administered 2019-11-20: 22:00:00 35 [IU] via SUBCUTANEOUS
  Filled 2019-11-20 (×2): qty 0.35

## 2019-11-20 MED ORDER — INSULIN ASPART 100 UNIT/ML ~~LOC~~ SOLN
3.0000 [IU] | Freq: Three times a day (TID) | SUBCUTANEOUS | Status: DC
  Administered 2019-11-20: 3 [IU] via SUBCUTANEOUS

## 2019-11-20 NOTE — Evaluation (Signed)
Physical Therapy Evaluation Patient Details Name: Anthony Flowers MRN: [id number] DOB: [birth year] Today's Date: 11/20/2019   History of Present Illness   Anthony Flowers is a 59 y.o. 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 Pt is NWB on L LE.  Clinical Impression  PTA patient was independent with functional mobility and ADLs without AD. Pt has no AD available at discharge. Pt demonstrates good overall functional strength requiring minG-minA for all functional transfers and gait today. Pt demonstrated high fatigue with ambulation with crutches ambulating 91ft requiring 2 standing rest breaks of approximately 30 seconds - 1 minute each. Pt able to mobilize 172ft with knee scooter without any near LOB today with CGA/supervision. Pt requires crutches upon discharge for stair/household mobility and knee scooter for improved community mobility due to limited endurance for safety. Due to current strength and balance, patient to follow surgeon recommendations for therapy at follow up appointment as there are not any evident immediate needs. PT will continue following acutely to progress advanced balance and gait with stairs.    Follow Up Recommendations Follow surgeon's recommendation for DC plan and follow-up therapies    Equipment Recommendations  Crutches;Other (comment)(Knee scooter for community ambulation, tub transfer bench)    Recommendations for Other Services       Precautions / Restrictions Precautions Precautions: Fall Required Braces or Orthoses: Other Brace Other Brace: CAM walker(LLE once WBAT) Restrictions Weight Bearing Restrictions: Yes LLE Weight Bearing: Non weight bearing      Mobility  Bed Mobility Overal bed mobility: Modified Independent    General bed mobility comments: Pt demonstrates modI mobility with no  VCs required for sequencing  Transfers Overall transfer level: Needs assistance Equipment used: Crutches(knee scooter) Transfers: Sit to/from Stand Sit to Stand: Min guard   General transfer comment: Pt required minG for safety with initial standing balance  Ambulation/Gait Ambulation/Gait assistance: Min guard Gait Distance (Feet): 75 Feet(134ft with knee scooter) Assistive device: Crutches Gait Pattern/deviations: Step-through pattern;Drifts right/left;Narrow base of support(hop through pattern) Gait velocity: mildly decreased Gait velocity interpretation: 1.31 - 2.62 ft/sec, indicative of limited community ambulator General Gait Details: pt required minA for 1 near LOB episode due to L drifting, able to self correct with minA.     Balance Overall balance assessment: Needs assistance Sitting-balance support: No upper extremity supported Sitting balance-Leahy Scale: Normal     Standing balance support: Bilateral upper extremity supported Standing balance-Leahy Scale: Good Standing balance comment: Pt required 1 minA with crutches to avoid LOB to L side with quick paced gait.         Pertinent Vitals/Pain Pain Assessment: Faces Faces Pain Scale: Hurts a little bit Pain Location: L foot/post surgical Pain Descriptors / Indicators: Grimacing;Guarding;Operative site guarding Pain Intervention(s): Monitored during session    Home Living Family/patient expects to be discharged to:: Private residence Living Arrangements: Spouse/significant other Available Help at Discharge: Friend(s);Family;Neighbor Type of Home: House Home Access: Other (comment)(1 step up)     Home Layout: One level Home Equipment: None      Prior Function Level of Independence: Independent          Hand Dominance   Dominant Hand: Right    Extremity/Trunk Assessment   Upper Extremity Assessment Upper Extremity Assessment: Overall WFL for tasks assessed;Defer to OT evaluation    Lower  Extremity Assessment Lower Extremity Assessment: Overall [facility] for  tasks assessed       Communication   Communication: No difficulties  Cognition Arousal/Alertness: Awake/alert Behavior During Therapy: WFL for tasks assessed/performed Overall Cognitive Status: Within Functional Limits for tasks assessed        General Comments: No deficits noted      General Comments General comments (skin integrity, edema, etc.): VSS throughout tx        Assessment/Plan    PT Assessment Patient needs continued PT services  PT Problem List Decreased activity tolerance;Decreased mobility;Decreased knowledge of use of DME;Decreased knowledge of precautions;Pain       PT Treatment Interventions Gait training;DME instruction;Functional mobility training;Therapeutic activities;Therapeutic exercise;Balance training;Stair training    PT Goals (Current goals can be found in the Care Plan section)  Acute Rehab PT Goals Patient Stated Goal: Get crutches so I can go home PT Goal Formulation: With patient Time For Goal Achievement: 12/04/19 Potential to Achieve Goals: Good    Frequency Min 3X/week    AM-PAC PT "6 Clicks" Mobility  Outcome Measure Help needed turning from your back to your side while in a flat bed without using bedrails?: None Help needed moving from lying on your back to sitting on the side of a flat bed without using bedrails?: None Help needed moving to and from a bed to a chair (including a wheelchair)?: None Help needed standing up from a chair using your arms (e.g., wheelchair or bedside chair)?: A Little Help needed to walk in hospital room?: A Little Help needed climbing 3-5 steps with a railing? : A Little 6 Click Score: 21    End of Session Equipment Utilized During Treatment: Gait belt Activity Tolerance: Patient tolerated treatment well Patient left: in bed;with call bell/phone within reach Nurse Communication: Mobility status PT Visit Diagnosis: Other  abnormalities of gait and mobility (R26.89);Difficulty in walking, not elsewhere classified (R26.2)    Time: 0786-7544 PT Time Calculation (min) (ACUTE ONLY): 37 min   Charges:   PT Evaluation $PT Eval Low Complexity: 1 Low PT Treatments $Gait Training: 8-22 mins        Ann Held PT, DPT Acute Rehab [facility] P: [phone]   Ryu Cerreta A Jaclyn Carew 11/20/2019, 4:16 PM

## 2019-11-20 NOTE — Plan of Care (Signed)
  Problem: Skin Integrity: Goal: Risk for impaired skin integrity will decrease Outcome: Progressing   Problem: Safety: Goal: Ability to remain free from injury will improve Outcome: Progressing   Problem: Pain Managment: Goal: General experience of comfort will improve Outcome: Progressing   Problem: Nutrition: Goal: Adequate nutrition will be maintained Outcome: Progressing   Problem: Activity: Goal: Risk for activity intolerance will decrease Outcome: Progressing   Problem: Clinical Measurements: Goal: Ability to maintain clinical measurements within normal limits will improve Outcome: Progressing   Problem: Education: Goal: Knowledge of General Education information will improve Description: Including pain rating scale, medication(s)/side effects and non-pharmacologic comfort measures Outcome: Progressing

## 2019-11-20 NOTE — Progress Notes (Signed)
PT Cancellation Note  Patient Details Name: Anthony Flowers MRN: [id number] DOB: [birth year]   Cancelled Treatment:    Reason Eval/Treat Not Completed: Patient declined, no reason specified Pt requests for PT to check later in the days as he just got back in bed. Will check back as time allows.   Ann Held PT, DPT Acute Rehab [facility] P: [phone]   Anthony Flowers 11/20/2019, 11:59 AM

## 2019-11-20 NOTE — Progress Notes (Signed)
Orthopedic Tech Progress Note Patient Details:  Anthony Flowers [birth year] [id number] Case Manager called requesting CRUTCHES for patient Ortho Devices Type of Ortho Device: Crutches Ortho Device/Splint Location: LLE Ortho Device/Splint Interventions: Adjustment   Post Interventions Patient Tolerated: Ambulated well Instructions Provided: Poper ambulation with device, Care of device   [name] 11/20/2019, 5:55 PM

## 2019-11-20 NOTE — TOC Initial Note (Signed)
Transition of Care [facility]) - Initial/Assessment Note    Patient Details  Name: Anthony Flowers MRN: [id number] Date of Birth: 18-Jul-1961  Transition of Care [facility]) CM/SW Contact:    Curlene Labrum, RN Phone Number: 11/20/2019, 5:01 PM  Clinical Narrative:  Case management is a pleasant 59 year old male, S/P Left transmetatarsal amputation.  Patient is a known diabetic with a glucometer at home.  He lives with his wife.  He does not have any DME at home.  He is a s/p left transmetatarsal foot amputation.   Ortho-tech was called and crutches ordered.  Patient's wife was given name, address, and number to [employer] to rent the knee scooter to have available by tomorrow for discharge.  Family offered Medicare choice and [facility] called and spoke with Butch Penny and [facility] RN and PT ordered to set up since patient should be discharged tomorrow.     Expected Discharge Plan: [place] Barriers to Discharge: No Barriers Identified   Patient Goals and CMS Choice Patient states their goals for this hospitalization and ongoing recovery are:: I'm ready to get better and go home tomorrow. CMS Medicare.gov Compare Post Acute Care list provided to:: Patient Choice offered to / list presented to : Patient  Expected Discharge Plan and Services Expected Discharge Plan: [place]   Discharge Planning Services: CM Consult Post Acute Care Choice: Durable Medical Equipment Living arrangements for the past 2 months: Single Family Home                 DME Arranged: Crutches(wife given information to pick up rentable knee skooter from [employer]) DME Agency: (crutches from ortho tech, knee scooter from [employer] - wife to pick up)     Representative spoke with at DME Agency: Crutches to be delivered to the room today - [place]            Prior Living Arrangements/Services Living arrangements for the past 2 months: Single Family Home Lives with::  Spouse Patient language and need for interpreter reviewed:: Yes Do you feel safe going back to the place where you live?: Yes      Need for Family Participation in Patient Care: Yes (Comment)     Criminal Activity/Legal Involvement Pertinent to Current Situation/Hospitalization: No - Comment as needed  Activities of Daily Living Home Assistive Devices/Equipment: None ADL Screening (condition at time of admission) Patient's cognitive ability adequate to safely complete daily activities?: Yes Is the patient deaf or have difficulty hearing?: No Does the patient have difficulty seeing, even when wearing glasses/contacts?: No Does the patient have difficulty concentrating, remembering, or making decisions?: No Patient able to express need for assistance with ADLs?: Yes Does the patient have difficulty dressing or bathing?: No Independently performs ADLs?: Yes (appropriate for developmental age) Does the patient have difficulty walking or climbing stairs?: Yes Weakness of Legs: Both Weakness of Arms/Hands: None  Permission Sought/Granted Permission sought to share information with : Case Manager Permission granted to share information with : Yes, Verbal Permission Granted     Permission granted to share info w AGENCY: [facility] granted to share info w Relationship: wife, Elmyra Ricks     Emotional Assessment Appearance:: Appears stated age Attitude/Demeanor/Rapport: Gracious Affect (typically observed): Accepting Orientation: : Oriented to Self, Oriented to Place, Oriented to  Time, Oriented to Situation Alcohol / Substance Use: Not Applicable Psych Involvement: No (comment)  Admission diagnosis:  DM foot ulcer (  [place]) [id number], U27.253] Patient Active Problem List   Diagnosis Date Noted  . Osteomyelitis due to type 2 diabetes mellitus ([place])   . DM foot ulcer ([place]) 11/17/2019  . Type 2 diabetes mellitus with diabetic polyneuropathy, with long-term current use  of insulin ([place]) 01/14/2019  . Type 2 diabetes mellitus with stage 4 chronic kidney disease, with long-term current use of insulin ([place]) 01/14/2019  . Type 2 diabetes mellitus with hyperglycemia, with long-term current use of insulin ([place]) 01/14/2019  . Dermatosis papulosa nigra 01/29/2017  . Localized swelling of both lower legs 01/10/2017  . Tobacco use 01/10/2017  . Anemia in chronic kidney disease 09/26/2016  . Hyperparathyroidism due to renal insufficiency ([place]) 09/26/2016  . Hyponatremia 09/26/2016  . Proteinuria 09/26/2016  . Renal osteodystrophy 09/26/2016  . GERD (gastroesophageal reflux disease) 10/19/2015  . OSA (obstructive sleep apnea) 10/06/2015  . Type II diabetes mellitus with neurological manifestations ([place]) 08/03/2015  . History of amputation 08/03/2015  . Dermatophytosis of nail 08/03/2015  . Pain in limb 08/03/2015  . Pre-ulcerative calluses 08/03/2015  . Hyperlipidemia 07/19/2015  . Edema of leg, bilateral 06/22/2015  . Apnea 06/22/2015  . Essential hypertension, benign 06/21/2015  . Nicotine dependence 06/21/2015  . Diabetic neuropathy ([place]) 06/21/2015  . Diabetes ([place]) 06/21/2015  . Chronic right shoulder pain 03/27/2015  . Acute on chronic osteomyelitis ([place]) 08/03/2014  . Foot pain, right 08/03/2014  . Chronic osteomyelitis involving ankle and foot ([place]) 07/12/2014  . Neurogenic pain of foot 07/12/2014  . Partial nontraumatic amputation of foot ([place]) 07/12/2014  . Foot infection 06/15/2014  . H/O amputation of foot ([place]) 05/12/2014  . Chronic osteomyelitis of right foot ([place]) 05/02/2014  . Chronic pain syndrome 05/02/2014  . Stage 3 chronic kidney disease 05/02/2014  . Diarrhea 04/06/2014  . Diabetic foot ulcer associated with type 2 diabetes mellitus ([place]) 07/29/2012   PCP:  Julian Hy, PA-C Pharmacy:   [facility] [address], [place] [facility] [address] [place] [address] Phone: [phone] Fax:  [phone]     Social Determinants of Health (SDOH) Interventions    Readmission Risk Interventions No flowsheet data found.

## 2019-11-20 NOTE — Progress Notes (Signed)
Subjective: Postop day #1 status post left transmetatarsal amputation.  He states he is doing well and having no significant pain to the foot.  He is having some vomiting but started this morning.  He denies any other nausea.  No fevers or chills.  No calf pain, chest pain, shortness of breath.   Objective: AAO x3, NAD DP/PT pulses palpable bilaterally, CRT less than 3 seconds Status post transmetatarsal amputation with a central area packed open.  There is decreased edema to the foot.  Minimal warmth.  No pain to the leg or swelling or warmth of the leg.  There is bloody drainage on the bandage but there is no purulence identified.  There is no pain.  Is no fluctuation or crepitation.  There is no malodor. No pain with calf compression, swelling, warmth, erythema          Assessment: Postop day #1 status post left transmetatarsal imitation  Plan: Overall he is doing well.  I did change the bandage today.  Wound was irrigated with saline and was packed with the corner approximating-4 x 4 dressing followed by dry sterile dressing.  Encouraged elevation.  Nonweightbearing.  If needed he can use his heel for transfers.  Wear cam boot at all times otherwise.  Will need home health care for dressing changes.  Likely discharge tomorrow on oral antibiotics.  His white blood cell count was elevated today but then this was in response to surgery. Will continue to follow  Celesta Gentile, DPM O: [phone] C: [phone]

## 2019-11-20 NOTE — Progress Notes (Signed)
PROGRESS NOTE  Anthony Flowers:[id number] DOB: 05-23-1961 DOA: 11/17/2019 PCP: Anthony Hy, PA-C   LOS: 3 days   Brief Narrative / Interim history: Anthony Flowers a 59 y.o.malewith medical history significant ofIDDM,CKD stage 2, hypertension,chronic diabetic foot infection of the left foot status post fifth and fourth toe amputation, who recently had a debridement of the throat left foot stump 3 weeks ago, and was placed on doxycycline for about 2 weeks. Patient was seen by podiatrist about 3 days ago, another debridement (office)was done and recommendation was to continue antibiotics. Over last 2 days patient started to have increasing left foot pain, swelling and rash toward the mid shin level of the left side, with fever with T-max 103.2 last ight.n  Subjective / 24h Interval events: Some dull pain in his foot but overall feels comfortable.  No fevers or chills  Assessment & Plan: Principal Problem Diabetic Foot Infection  Osteomyelitis -Wound cx from 3/25 with MRSA resistant to clindamycin but sensitive to Bactrim and pansensitive streptococcus agalactiae -MRI 3/29 with ill defined fluid collections adjacent to 3rd and 4th metatarsal heads suspicious for abscess -failed outpatient ABX and surgery treatment -Sepsis evidenced by fever and elevated white count and evidence of infection  -Continue vanc/cefepime/flagyl -Dr. Jacqualyn Flowers with podiatry consulted, patient underwent transmetatarsal amputation on left, discussed with Dr. Jacqualyn Flowers last night, will reevaluate the wound today but appears to have clean margins and will need antibiotics while hospitalized with hopefully if the wound looks good and if it leukocytosis is improving can potentially go home on Friday -Monitor Intra-Op cultures  Active problems AKI on CKD stage II -Hold his ARB and HCTZ, UA is bland, with baseline creatinine appears to be around 2, currently approaching  baseline  HTN -Continuebeta-blocker and amlodipine -Hold ARB and HCTZ , blood pressure acceptable today  IDDM -Very brittle diabetes yesterday, change Lantus to close to his home dose and adjusted mealtime NovoLog to somewhat match his home regimen   CBG (last 3)  Recent Labs    11/19/19 1813 11/19/19 2112 11/20/19 1036  GLUCAP 113* 305* 228*   HLD -On statin   Scheduled Meds: . acetaminophen  1,000 mg Oral Q8H  . amLODipine  10 mg Oral Daily  . aspirin EC  81 mg Oral Daily  . atorvastatin  20 mg Oral Daily  . carvedilol  6.25 mg Oral BID  . insulin aspart  0-20 Units Subcutaneous TID WC  . insulin aspart  0-5 Units Subcutaneous QHS  . insulin aspart  3 Units Subcutaneous TID WC  . insulin glargine  35 Units Subcutaneous QHS  . mupirocin ointment  1 application Nasal BID  . pantoprazole  40 mg Oral Daily  . sodium chloride flush  3 mL Intravenous Once   Continuous Infusions: . ceFEPime (MAXIPIME) IV 2 g (11/20/19 0552)  . metronidazole 500 mg (11/20/19 1044)  . vancomycin 1,000 mg (11/20/19 0559)   PRN Meds:.HYDROmorphone (DILAUDID) injection, ondansetron (ZOFRAN) IV, oxyCODONE **OR** oxyCODONE, sodium chloride flush  DVT prophylaxis: SCDs Code Status: Full code Family Communication: Discussed with patient Patient admitted from: Home Anticipated d/c place: Home Barriers to d/c: Undergoing surgery today  Consultants:  Podiatry   Procedures:  None   Microbiology  No growth  Antimicrobials: Vancomycin, cefepime, metronidazole 3/30 >>   Objective: Vitals:   11/19/19 1950 11/20/19 0050 11/20/19 0426 11/20/19 0801  BP: 100/85 95/62 130/78 120/74  Pulse: 72 66 70 75  Resp: 20 18 18 18   Temp: 98.3 F (36.8 C) (!)  97.5 F (36.4 C) (!) 97.4 F (36.3 C) 97.9 F (36.6 C)  TempSrc: Oral Oral Oral Oral  SpO2: 98% 96% 100% 100%  Weight:      Height:        Intake/Output Summary (Last 24 hours) at 11/20/2019 1051 Last data filed at 11/20/2019 0934 Gross  per 24 hour  Intake 4344.17 ml  Output 2900 ml  Net 1444.17 ml   Filed Weights   11/17/19 1215 11/17/19 1700 11/19/19 1433  Weight: 106.6 kg 106.6 kg 106.6 kg    Examination:  Constitutional: No distress, in chair Eyes: No scleral icterus ENMT: Moist membranes Neck: normal, supple Respiratory: Clear bilaterally, no wheezing or crackles Cardiovascular: Regular rate and rhythm, no murmurs, no edema Abdomen: Soft, nontender, nondistended, bowel sounds positive Musculoskeletal: no clubbing / cyanosis.  Skin: No rashes seen Neurologic: No focal deficits  Data Reviewed: I have independently reviewed following labs and imaging studies   CBC: Recent Labs  Lab 11/17/19 1226 11/18/19 0629 11/19/19 0553 11/20/19 0428  WBC 14.7* 12.7* 12.4* 14.4*  NEUTROABS 10.2*  --  8.0*  --   HGB 12.9* 11.3* 11.0* 10.2*  HCT 41.4 36.2* 35.1* 32.8*  MCV 73.7* 72.4* 73.1* 73.5*  PLT 441* 345 432* 269*   Basic Metabolic Panel: Recent Labs  Lab 11/17/19 1226 11/18/19 0629 11/19/19 0553 11/20/19 0428  NA 132* 130* 128* 134*  K 4.8 4.4 4.8 5.3*  CL 96* 95* 94* 101  CO2 25 21* 22 22  GLUCOSE 195* 125* 303* 247*  BUN 37* 43* 36* 34*  CREATININE 2.94* 2.70* 2.44* 2.15*  CALCIUM 10.1 9.3 9.6 9.2  MG  --   --  1.5*  --   PHOS  --   --  3.3  --    Liver Function Tests: Recent Labs  Lab 11/17/19 1226 11/19/19 0553  AST 36 30  ALT 30 24  ALKPHOS 100 100  BILITOT 1.0 1.0  PROT 7.9 6.9  ALBUMIN 3.1* 2.5*   Coagulation Profile: No results for input(s): INR, PROTIME in the last 168 hours. HbA1C: Recent Labs    11/17/19 1822  HGBA1C 8.3*   CBG: Recent Labs  Lab 11/19/19 1605 11/19/19 1739 11/19/19 1813 11/19/19 2112 11/20/19 1036  GLUCAP 153* 61* 113* 305* 228*    Recent Results (from the past 240 hour(s))  WOUND CULTURE     Status: Abnormal   Collection Time: 11/13/19  5:12 PM   Specimen: Foot, Left; Wound  Result Value Ref Range Status   MICRO NUMBER: [id number]  Final    SPECIMEN QUALITY: Adequate  Final   SOURCE: WOUND (SITE NOT SPECIFIED)  Final   STATUS: FINAL  Final   GRAM STAIN:   Final    No white blood cells seen No epithelial cells seen Few Gram positive cocci in pairs   ISOLATE 1: methicillin resistant Staphylococcus aureus (A)  Final    Comment: Moderate growth of Methicillin resistant Staphylococcus aureus (MRSA)   ISOLATE 2: Streptococcus agalactiae (A)  Final    Comment: Heavy growth of Group B Streptococcus isolated Beta-hemolytic streptococci are predictably susceptible to Penicillin and other beta-lactams. Susceptibility testing not routinely performed. Please contact the laboratory within 3 days if susceptibility  testing is desired.       Susceptibility   Streptococcus agalactiae - AEROBIC CULT, GRAM STAIN POSITIVE 2    AMPICILLIN <=0.25 Sensitive     CEFOTAXIME <=0.12 Sensitive     CEFTRIAXONE <=0.12 Sensitive     VANCOMYCIN 0.5  Sensitive     CLINDAMYCIN <=0.25 Sensitive     LEVOFLOXACIN 1 Sensitive     PENO - penicillin* 0.12 Sensitive      * Oxacillin-resistant staphylococci are resistant toall currently available beta-lactam antimicrobialagents including penicillins, beta lactam/beta-lactamase inhibitor combinations, and cephems withstaphylococcal indications, including Cefazolin.Legend:S = Susceptible  I = IntermediateR = Resistant  NS = Not susceptible* = Not tested  NR = Not reported**NN = See antimicrobic comments   Methicillin resistant staphylococcus aureus - AEROBIC CULT, GRAM STAIN POSITIVE 1    VANCOMYCIN 1 Sensitive     CIPROFLOXACIN <=0.5 Sensitive     CLINDAMYCIN >=8 Resistant     LEVOFLOXACIN <=0.12 Sensitive     ERYTHROMYCIN >=8 Resistant     GENTAMICIN <=0.5 Sensitive     OXACILLIN* NR Resistant      * Oxacillin-resistant staphylococci are resistant toall currently available beta-lactam antimicrobialagents including penicillins, beta lactam/beta-lactamase inhibitor combinations, and cephems withstaphylococcal  indications, including Cefazolin.    TETRACYCLINE >=16 Resistant     TRIMETH/SULFA <=10 Sensitive   SARS CORONAVIRUS 2 (TAT 6-24 HRS) Nasopharyngeal Nasopharyngeal Swab     Status: None   Collection Time: 11/17/19  4:09 PM   Specimen: Nasopharyngeal Swab  Result Value Ref Range Status   SARS Coronavirus 2 NEGATIVE NEGATIVE Final    Comment: (NOTE) SARS-CoV-2 target nucleic acids are NOT DETECTED. The SARS-CoV-2 RNA is generally detectable in upper and lower respiratory specimens during the acute phase of infection. Negative results do not preclude SARS-CoV-2 infection, do not rule out co-infections with other pathogens, and should not be used as the sole basis for treatment or other patient management decisions. Negative results must be combined with clinical observations, patient history, and epidemiological information. The expected result is Negative. Fact Sheet for Patients: SugarRoll.be Fact Sheet for Healthcare Providers: https://www.woods-mathews.com/ This test is not yet approved or cleared by the [place] FDA and  has been authorized for detection and/or diagnosis of SARS-CoV-2 by FDA under an Emergency Use Authorization (EUA). This EUA will remain  in effect (meaning this test can be used) for the duration of the COVID-19 declaration under Section 56 4(b)(1) of the Act, 21 U.S.C. section 360bbb-3(b)(1), unless the authorization is terminated or revoked sooner. Performed at [facility], [place] [address], [place], [place] [address]   Culture, blood (routine x 2)     Status: None (Preliminary result)   Collection Time: 11/18/19  1:40 PM   Specimen: BLOOD  Result Value Ref Range Status   Specimen Description BLOOD LEFT ANTECUBITAL  Final   Special Requests AEROBIC BOTTLE ONLY Blood Culture adequate volume  Final   Culture   Final    NO GROWTH 2 DAYS Performed at [facility], [place] [address], [place], [place]  [address]    Report Status PENDING  Incomplete  Culture, blood (routine x 2)     Status: None (Preliminary result)   Collection Time: 11/18/19  1:45 PM   Specimen: BLOOD  Result Value Ref Range Status   Specimen Description BLOOD LEFT ANTECUBITAL  Final   Special Requests AEROBIC BOTTLE ONLY Blood Culture adequate volume  Final   Culture   Final    NO GROWTH 2 DAYS Performed at [facility], [place] [address] [place],  [address]    Report Status PENDING  Incomplete  Surgical PCR screen     Status: None   Collection Time: 11/19/19  4:06 AM   Specimen: Nasal Mucosa; Nasal Swab  Result Value Ref Range Status   MRSA, PCR NEGATIVE NEGATIVE Final   Staphylococcus aureus NEGATIVE NEGATIVE Final    Comment: (NOTE) The Xpert SA Assay (FDA approved for NASAL specimens in patients 88 years of age and older), is one component of a comprehensive surveillance program. It is not intended to diagnose infection nor to guide or monitor treatment. Performed at [facility], [place] [address] [place], [place] [address]   Aerobic/Anaerobic Culture (surgical/deep wound)     Status: None (Preliminary result)   Collection Time: 11/19/19  4:27 PM   Specimen: Wound  Result Value Ref Range Status   Specimen Description WOUND LEFT FOOT  Final   Special Requests NONE  Final   Gram Stain   Final    FEW WBC PRESENT, PREDOMINANTLY PMN NO ORGANISMS SEEN Performed at [facility], [address], [place],  [address]    Culture PENDING  Incomplete   Report Status PENDING  Incomplete     Radiology Studies: No results found.  Marzetta Board, MD, PhD [facility]  Between 7 am - 7 pm I am available, please contact me via Amion or Securechat  Between 7 pm - 7 am I am not available, please contact night coverage MD/APP via Amion

## 2019-11-21 LAB — CBC
HCT: 31.3 % — ABNORMAL LOW (ref 39.0–52.0)
Hemoglobin: 9.5 g/dL — ABNORMAL LOW (ref 13.0–17.0)
MCH: 22.4 pg — ABNORMAL LOW (ref 26.0–34.0)
MCHC: 30.4 g/dL (ref 30.0–36.0)
MCV: 73.6 fL — ABNORMAL LOW (ref 80.0–100.0)
Platelets: 463 K/uL — ABNORMAL HIGH (ref 150–400)
RBC: 4.25 MIL/uL (ref 4.22–5.81)
RDW: 14.1 % (ref 11.5–15.5)
WBC: 13.8 K/uL — ABNORMAL HIGH (ref 4.0–10.5)
nRBC: 0 % (ref 0.0–0.2)

## 2019-11-21 LAB — COMPREHENSIVE METABOLIC PANEL WITH GFR
ALT: 19 U/L (ref 0–44)
AST: 33 U/L (ref 15–41)
Albumin: 2.4 g/dL — ABNORMAL LOW (ref 3.5–5.0)
Alkaline Phosphatase: 82 U/L (ref 38–126)
Anion gap: 12 (ref 5–15)
BUN: 27 mg/dL — ABNORMAL HIGH (ref 6–20)
CO2: 22 mmol/L (ref 22–32)
Calcium: 9.2 mg/dL (ref 8.9–10.3)
Chloride: 101 mmol/L (ref 98–111)
Creatinine, Ser: 1.99 mg/dL — ABNORMAL HIGH (ref 0.61–1.24)
GFR calc Af Amer: 41 mL/min — ABNORMAL LOW (ref >60)
GFR calc non Af Amer: 36 mL/min — ABNORMAL LOW (ref >60)
Glucose, Bld: 163 mg/dL — ABNORMAL HIGH (ref 70–99)
Potassium: 4.5 mmol/L (ref 3.5–5.1)
Sodium: 135 mmol/L (ref 135–145)
Total Bilirubin: 0.9 mg/dL (ref 0.3–1.2)
Total Protein: 6.5 g/dL (ref 6.5–8.1)

## 2019-11-21 LAB — GLUCOSE, CAPILLARY
Glucose-Capillary: 172 mg/dL — ABNORMAL HIGH (ref 70–99)
Glucose-Capillary: 265 mg/dL — ABNORMAL HIGH (ref 70–99)

## 2019-11-21 MED ORDER — HYDROCODONE-ACETAMINOPHEN 10-325 MG PO TABS: 1.0000 | ORAL_TABLET | Freq: Four times a day (QID) | ORAL | 0 refills | Status: AC | PRN

## 2019-11-21 MED ORDER — CEPHALEXIN 500 MG PO CAPS: 500.0000 mg | ORAL_CAPSULE | Freq: Three times a day (TID) | ORAL | 0 refills | Status: AC

## 2019-11-21 NOTE — Progress Notes (Signed)
Subjective: Postop day #2 status post left transmetatarsal amputation. He is doing well. Minimal pain. No fevers, chills, nausea, vomiting. No calf pain, chest pain, SOB. He is dealing with some family issues and is ready to go home.   Objective: AAO x3, NAD DP/PT pulses palpable bilaterally, CRT less than 3 seconds Status post transmetatarsal amputation with a central area packed open. There is improved edema to the foot. No erythema, warmth, ascending cellulitis. No malodor. No fluctuation or crepitation. No purulence. Mild bloody drainage present in the wound.  No pain with calf compression, swelling, warmth, erythema  Assessment: Postop day #2 status post left transmetatarsal amputation   Plan: Overall he is doing well.  I did change the bandage today. Wound irrigated. Dressing changed. He is doing much better. WBC improving and overall the foot is doing significantly better. He has been afebrile. Will discharge on oral antibiotics with close follow up. Return precautions advised. He is going to come to the office Monday for dressing change.    Celesta Gentile, DPM O: [phone] C: [phone]

## 2019-11-21 NOTE — TOC Transition Note (Signed)
Transition of Care [facility]) - CM/SW Discharge Note   Patient Details  Name: LEMOND GRIFFEE MRN: [id number] Date of Birth: Mar 25, 1961  [facility]) CM/SW Contact:  Curlene Labrum, RN Phone Number: 11/21/2019, 10:13 AM   Clinical Narrative:    Case management met with the patient prior to discharge to discuss patient's Home Health therapies through [employer] for PT/RN.  Patient given a copy for the order for rolling scooter to pick up at [employer] in [place], [place] along with a demographics sheet to assist with pickup from the store if needed.  No other discharge needs at this time.  Patient's wife will be picking him up by car to transport him home this morning.  Dr. Jacqualyn Posey at bedside.   Final next level of care: [place] Barriers to Discharge: No Barriers Identified   Patient Goals and CMS Choice Patient states their goals for this hospitalization and ongoing recovery are:: I'm ready to get better and go home tomorrow. CMS Medicare.gov Compare Post Acute Care list provided to:: Patient Choice offered to / list presented to : Patient  Discharge Placement                       Discharge Plan and Services   Discharge Planning Services: CM Consult Post Acute Care Choice: Durable Medical Equipment          DME Arranged: Crutches(wife given information to pick up rentable knee skooter from [employer]) DME Agency: (crutches from ortho tech, knee scooter from [employer] - wife to pick up)     Representative spoke with at DME Agency: Crutches to be delivered to the room today - [place]            Social Determinants of Health (SDOH) Interventions     Readmission Risk Interventions No flowsheet data found.

## 2019-11-21 NOTE — Plan of Care (Signed)

## 2019-11-21 NOTE — Discharge Summary (Signed)
Physician Discharge Summary  [name]:[id number] DOB: July 20, 1961 DOA: 11/17/2019  PCP: Julian Hy, PA-C  Admit date: 11/17/2019 Discharge date: 11/21/2019  Admitted From: home Disposition:  home  Recommendations for Outpatient Follow-up:  1. Follow up with Dr Jacqualyn Posey in office as scheduled  Home Health: PT Equipment/Devices: walker  Discharge Condition: stable CODE STATUS: Full code Diet recommendation: diabetic   HPI: Per admitting MD, Anthony Flowers is a 59 y.o. 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  Patient was seen by podiatrist about 3 days ago, another debridement (office) was done and recommendation was to continue antibiotics.  Over last 2 days patient started to have increasing left foot pain, swelling and rash toward the mid shin level of the left side, with fever with T-max 103.2 last night. ED Course: WBC 14.7, creatinine 2.9 compared to 2.0 1-year ago  Hospital Course / Discharge diagnoses: Principal Problem Sepsis due to diabetic Foot Infection  Osteomyelitis -Patient was admitted to the hospital and started on broad spectrum antibiotics. Underwent an MRI 3/29 with ill defined fluid collections adjacent to 3rd and 4th metatarsal heads suspicious for abscess. Podiatry was consulted and underwent transmetatarsal amputation on left. Intraoperative cultures with group B strep. He will be converted to oral antibiotics with Keflex given reported penicillin allergy, is tolerating Cephalosporins well.  Active problems AKI on CKD stage II -Cr at baseline, ~2. HTN -Continue home medications IDDM -continue home regimen  HLD -On statin  Discharge Instructions   Allergies as of 11/21/2019      Reactions   Latex Hives   Tape Hives   Chlorhexidine Itching, Rash   Develops skin irritation   Fish Allergy Hives, Rash   Patient unsure which fish   Penicillins Itching, Swelling, Rash   Did it involve swelling of the face/tongue/throat, SOB, or low BP? Yes Did it involve sudden or severe rash/hives, skin peeling, or any reaction on the inside of your mouth or nose? No Did you need to seek medical attention at a hospital or doctor's office? Yes When did it last happen?[age] years old If all above answers are "NO", may proceed with cephalosporin use.      Medication List    STOP taking these medications   doxycycline 100 MG tablet Commonly known as: VIBRA-TABS     TAKE these medications   Accu-Chek Guide w/Device Kit   amLODipine 10 MG tablet Commonly known as: NORVASC Take 10 mg by mouth daily.   aspirin EC 81 MG tablet Take 81 mg by mouth daily.   atorvastatin 10 MG tablet Commonly known as: LIPITOR Take 10 mg by mouth daily.   B-D ULTRAFINE III SHORT PEN 31G X 8 MM Misc Generic drug: Insulin Pen Needle 4 (four) times daily. for testing   carvedilol 6.25 MG tablet Commonly known as: COREG Take 6.25 mg by mouth 2 (two) times daily.   cephALEXin 500 MG capsule Commonly known as: KEFLEX Take 1 capsule (500 mg total) by mouth 3 (three) times daily for 7 days.   Chantix Continuing Month Pak 1 MG tablet Generic drug: varenicline Take 1 mg by mouth 2 (two) times daily.   diclofenac Sodium 1 % Gel Commonly known as: VOLTAREN Apply 1 application topically daily as needed (  muscle pain).   glucose blood test strip Commonly known as: OneTouch Verio 1 each by Other route 3 (three) times daily. And lancets 3/day   HYDROcodone-acetaminophen 10-325 MG tablet Commonly known as: NORCO Take 1 tablet by mouth every 6 (six) hours as needed for up to 3 days (pain). What changed: Another medication with the same name was removed. Continue taking this medication, and follow the  directions you see here.   insulin aspart 100 UNIT/ML FlexPen Commonly known as: NovoLOG FlexPen Inject 25 Units into the skin 3 (three) times daily with meals. And pen needles 4/day What changed: how much to take   Lantus SoloStar 100 UNIT/ML Solostar Pen Generic drug: insulin glargine Inject 20 Units into the skin at bedtime. What changed: how much to take   lidocaine 5 % Commonly known as: Lidoderm Place 1 patch onto the skin daily. Remove & Discard patch within 12 hours or as directed by MD What changed:   when to take this  reasons to take this   omeprazole 40 MG capsule Commonly known as: PRILOSEC Take 40 mg by mouth daily.   Ozempic (1 MG/DOSE) 2 MG/1.5ML Sopn Generic drug: Semaglutide (1 MG/DOSE) Inject 1 mg into the skin every Sunday.   Santyl ointment Generic drug: collagenase Apply 1 application topically daily. What changed: when to take this   valsartan-hydrochlorothiazide 160-25 MG tablet Commonly known as: DIOVAN-HCT Take 1 tablet by mouth daily.            Durable Medical Equipment  (From admission, onward)         Start     Ordered   11/21/19 1000  For home use only DME Other see comment  Once    Comments: Patient will need a rolling knee scooter for mobility at home as requested in the PT notes.  Question:  Length of Need  Answer:  6 Months   11/21/19 1000         Follow-up Information    [facility] Follow [facility].   Specialty: [place] Why: [facility] will be providing Home Health PT/RN to assist with therapy and dressing changes as ordered by your physician.  They will call you within 24 hours of your discharge home from the hospital.       [facility] Follow up.   Why: Please pick up a rolling knee scooter from [employer] to use for your mobility at home. Contact information: [address] [place], [place]  [phone]           Consultations:  Podiatry  Procedures/Studies:  MR FOOT LEFT WO CONTRAST  Result Date: 11/17/2019 CLINICAL DATA:  Diabetic foot ulcer. Failed outpatient antibiotic therapy. Fever and leukocytosis with impending sepsis. Evaluate for osteomyelitis. EXAM: MRI OF THE LEFT FOOT WITHOUT CONTRAST TECHNIQUE: Multiplanar, multisequence MR imaging of the left forefoot was performed. No intravenous contrast was administered. COMPARISON:  Prior foot radiographs ranging from a 05/27/2018 through 11/17/2019. FINDINGS: Bones/Joint/Cartilage Patient is status post remote amputation of the 4th and 5th toes with amputation through the mid 5th metatarsal. There is marrow T2 hyperintensity within the 3rd metatarsal head, and to a lesser degree the 4th metatarsal head. There is mild marrow edema within the base of the 3rd proximal phalanx as well and a small 3rd MTP joint effusion. The 2nd ray appears unremarkable. Chronic advanced arthropathy at the 1st MTP joint appears stable. The 5th metatarsal remnant and visualized tarsal bones appear normal.  Ligaments Intact Lisfranc ligament. Muscles and Tendons Diffuse forefoot muscular atrophy. No significant tenosynovitis. Soft tissues There are inflammatory changes with ill-defined fluid collections in the lateral aspect of the forefoot. There is apparent focal skin ulceration plantar to the 3rd metatarsal head, best seen on coronal image 20/9. The fluid collections are largest along the dorsal aspects of the 3rd and 4th metatarsals, measuring up to 3.7 x 1.9 x 1.4 cm, suspicious for abscesses. IMPRESSION: 1. Focal skin ulceration plantar to the 3rd metatarsal head with underlying inflammatory changes in the lateral aspect of the forefoot. Ill-defined fluid collections adjacent to the 3rd and 4th metatarsal heads suspicious for abscesses. 2. Marrow T2 hyperintensity within the 3rd metatarsal head and to a lesser extent the 4th metatarsal head, suspicious for osteomyelitis. 3.  Postsurgical changes from remote amputation of the 4th and 5th toes and amputation through the 5th metatarsal shaft. Electronically Signed   By: Richardean Sale M.D.   On: 11/17/2019 19:01   DG Foot Complete Left  Result Date: 11/18/2019 Please see detailed radiograph report in office note.  DG Foot Complete Left  Result Date: 11/17/2019 CLINICAL DATA:  Worsening diabetic ulcer. EXAM: LEFT FOOT - COMPLETE 3+ VIEW COMPARISON:  11/13/2019 FINDINGS: No change since the study of 4 days ago. Previous amputation of the fourth and fifth toes. No acute evidence of fracture or osteomyelitis. Chronic degenerative changes of the MTP joint of the great toe. IMPRESSION: No change since 4 days ago. Previous fourth and fifth toe amputations. No sign of acute fracture or identifiable osteomyelitis. Electronically Signed   By: Nelson Chimes M.D.   On: 11/17/2019 12:53   DG Foot Complete Left  Result Date: 10/28/2019 Please see detailed radiograph report in office note.     Subjective: - no chest pain, shortness of breath, no abdominal pain, nausea or vomiting.   Discharge Exam: BP 136/61 (BP Location: Right Arm)   Pulse 73   Temp 98.3 F (36.8 C) (Oral)   Resp 16   Ht 6' 0.01" (1.829 m)   Wt 106.6 kg   SpO2 97%   BMI 31.87 kg/m   General: Pt is alert, awake, not in acute distress Cardiovascular: RRR, S1/S2 +, no rubs, no gallops Respiratory: CTA bilaterally, no wheezing, no rhonchi Abdominal: Soft, NT, ND, bowel sounds + Extremities: no edema, no cyanosis   The results of significant diagnostics from this hospitalization (including imaging, microbiology, ancillary and laboratory) are listed below for reference.     Microbiology: Recent Results (from the past 240 hour(s))  WOUND CULTURE     Status: Abnormal   Collection Time: 11/13/19  5:12 PM   Specimen: Foot, Left; Wound  Result Value Ref Range Status   MICRO NUMBER: [id number]  Final   SPECIMEN QUALITY: Adequate  Final   SOURCE: WOUND  (SITE NOT SPECIFIED)  Final   STATUS: FINAL  Final   GRAM STAIN:   Final    No white blood cells seen No epithelial cells seen Few Gram positive cocci in pairs   ISOLATE 1: methicillin resistant Staphylococcus aureus (A)  Final    Comment: Moderate growth of Methicillin resistant Staphylococcus aureus (MRSA)   ISOLATE 2: Streptococcus agalactiae (A)  Final    Comment: Heavy growth of Group B Streptococcus isolated Beta-hemolytic streptococci are predictably susceptible to Penicillin and other beta-lactams. Susceptibility testing not routinely performed. Please contact the laboratory within 3 days if susceptibility  testing is desired.       Susceptibility   Streptococcus  agalactiae - AEROBIC CULT, GRAM STAIN POSITIVE 2    AMPICILLIN <=0.25 Sensitive     CEFOTAXIME <=0.12 Sensitive     CEFTRIAXONE <=0.12 Sensitive     VANCOMYCIN 0.5 Sensitive     CLINDAMYCIN <=0.25 Sensitive     LEVOFLOXACIN 1 Sensitive     PENO - penicillin* 0.12 Sensitive      * Oxacillin-resistant staphylococci are resistant toall currently available beta-lactam antimicrobialagents including penicillins, beta lactam/beta-lactamase inhibitor combinations, and cephems withstaphylococcal indications, including Cefazolin.Legend:S = Susceptible  I = IntermediateR = Resistant  NS = Not susceptible* = Not tested  NR = Not reported**NN = See antimicrobic comments   Methicillin resistant staphylococcus aureus - AEROBIC CULT, GRAM STAIN POSITIVE 1    VANCOMYCIN 1 Sensitive     CIPROFLOXACIN <=0.5 Sensitive     CLINDAMYCIN >=8 Resistant     LEVOFLOXACIN <=0.12 Sensitive     ERYTHROMYCIN >=8 Resistant     GENTAMICIN <=0.5 Sensitive     OXACILLIN* NR Resistant      * Oxacillin-resistant staphylococci are resistant toall currently available beta-lactam antimicrobialagents including penicillins, beta lactam/beta-lactamase inhibitor combinations, and cephems withstaphylococcal indications, including Cefazolin.    TETRACYCLINE >=16  Resistant     TRIMETH/SULFA <=10 Sensitive   SARS CORONAVIRUS 2 (TAT 6-24 HRS) Nasopharyngeal Nasopharyngeal Swab     Status: None   Collection Time: 11/17/19  4:09 PM   Specimen: Nasopharyngeal Swab  Result Value Ref Range Status   SARS Coronavirus 2 NEGATIVE NEGATIVE Final    Comment: (NOTE) SARS-CoV-2 target nucleic acids are NOT DETECTED. The SARS-CoV-2 RNA is generally detectable in upper and lower respiratory specimens during the acute phase of infection. Negative results do not preclude SARS-CoV-2 infection, do not rule out co-infections with other pathogens, and should not be used as the sole basis for treatment or other patient management decisions. Negative results must be combined with clinical observations, patient history, and epidemiological information. The expected result is Negative. Fact Sheet for Patients: SugarRoll.be Fact Sheet for Healthcare Providers: https://www.woods-mathews.com/ This test is not yet approved or cleared by the [place] FDA and  has been authorized for detection and/or diagnosis of SARS-CoV-2 by FDA under an Emergency Use Authorization (EUA). This EUA will remain  in effect (meaning this test can be used) for the duration of the COVID-19 declaration under Section 56 4(b)(1) of the Act, 21 U.S.C. section 360bbb-3(b)(1), unless the authorization is terminated or revoked sooner. Performed at [facility], [place] [address], [place], [place] [address]   Culture, blood (routine x 2)     Status: None (Preliminary result)   Collection Time: 11/18/19  1:40 PM   Specimen: BLOOD  Result Value Ref Range Status   Specimen Description BLOOD LEFT ANTECUBITAL  Final   Special Requests AEROBIC BOTTLE ONLY Blood Culture adequate volume  Final   Culture   Final    NO GROWTH 2 DAYS Performed at [facility], [place] [address] [place], [place] [address]    Report Status PENDING  Incomplete  Culture,  blood (routine x 2)     Status: None (Preliminary result)   Collection Time: 11/18/19  1:45 PM   Specimen: BLOOD  Result Value Ref Range Status   Specimen Description BLOOD LEFT ANTECUBITAL  Final   Special Requests AEROBIC BOTTLE ONLY Blood Culture adequate volume  Final   Culture   Final    NO GROWTH 2 DAYS Performed at [facility], [place] [address], [place],  [address]  Report Status PENDING  Incomplete  Surgical PCR screen     Status: None   Collection Time: 11/19/19  4:06 AM   Specimen: Nasal Mucosa; Nasal Swab  Result Value Ref Range Status   MRSA, PCR NEGATIVE NEGATIVE Final   Staphylococcus aureus NEGATIVE NEGATIVE Final    Comment: (NOTE) The Xpert SA Assay (FDA approved for NASAL specimens in patients 27 years of age and older), is one component of a comprehensive surveillance program. It is not intended to diagnose infection nor to guide or monitor treatment. Performed at [facility], [place] [address] [place], [place] [address]   Aerobic/Anaerobic Culture (surgical/deep wound)     Status: None (Preliminary result)   Collection Time: 11/19/19  4:27 PM   Specimen: Wound  Result Value Ref Range Status   Specimen Description WOUND LEFT FOOT  Final   Special Requests NONE  Final   Gram Stain   Final    FEW WBC PRESENT, PREDOMINANTLY PMN NO ORGANISMS SEEN Performed at [facility], [address] [place], [place] [address]    Culture   Final    RARE GROUP B STREP(S.AGALACTIAE)ISOLATED TESTING AGAINST S. AGALACTIAE NOT ROUTINELY PERFORMED DUE TO PREDICTABILITY OF AMP/PEN/VAN SUSCEPTIBILITY. NO ANAEROBES ISOLATED; CULTURE IN PROGRESS FOR 5 DAYS    Report Status PENDING  Incomplete     Labs: Basic Metabolic Panel: Recent Labs  Lab 11/17/19 1226 11/18/19 0629 11/19/19 0553 11/20/19 0428 11/21/19 0704  NA 132* 130* 128* 134* 135  K 4.8 4.4 4.8 5.3* 4.5  CL 96* 95* 94* 101 101  CO2 25 21* '22 22 22  ' GLUCOSE 195* 125* 303* 247* 163*    BUN 37* 43* 36* 34* 27*  CREATININE 2.94* 2.70* 2.44* 2.15* 1.99*  CALCIUM 10.1 9.3 9.6 9.2 9.2  MG  --   --  1.5*  --   --   PHOS  --   --  3.3  --   --    Liver Function Tests: Recent Labs  Lab 11/17/19 1226 11/19/19 0553 11/21/19 0704  AST 36 30 33  ALT '30 24 19  ' ALKPHOS 100 100 82  BILITOT 1.0 1.0 0.9  PROT 7.9 6.9 6.5  ALBUMIN 3.1* 2.5* 2.4*   CBC: Recent Labs  Lab 11/17/19 1226 11/18/19 0629 11/19/19 0553 11/20/19 0428 11/21/19 0704  WBC 14.7* 12.7* 12.4* 14.4* 13.8*  NEUTROABS 10.2*  --  8.0*  --   --   HGB 12.9* 11.3* 11.0* 10.2* 9.5*  HCT 41.4 36.2* 35.1* 32.8* 31.3*  MCV 73.7* 72.4* 73.1* 73.5* 73.6*  PLT 441* 345 432* 487* 463*   CBG: Recent Labs  Lab 11/20/19 1036 11/20/19 1133 11/20/19 1657 11/20/19 2104 11/21/19 0641  GLUCAP 228* 228* 153* 252* 172*   Hgb A1c No results for input(s): HGBA1C in the last 72 hours. Lipid Profile No results for input(s): CHOL, HDL, LDLCALC, TRIG, CHOLHDL, LDLDIRECT in the last 72 hours. Thyroid function studies No results for input(s): TSH, T4TOTAL, T3FREE, THYROIDAB in the last 72 hours.  Invalid input(s): FREET3 Urinalysis    Component Value Date/Time   COLORURINE YELLOW 11/18/2019 1051   APPEARANCEUR CLEAR 11/18/2019 1051   LABSPEC 1.012 11/18/2019 1051   PHURINE 5.0 11/18/2019 1051   GLUCOSEU NEGATIVE 11/18/2019 1051   HGBUR NEGATIVE 11/18/2019 1051   BILIRUBINUR NEGATIVE 11/18/2019 1051   KETONESUR NEGATIVE 11/18/2019 1051   PROTEINUR NEGATIVE 11/18/2019 1051   UROBILINOGEN >8.0 (H) 03/06/2010 2057   NITRITE NEGATIVE 11/18/2019 1051  LEUKOCYTESUR NEGATIVE 11/18/2019 1051    FURTHER DISCHARGE INSTRUCTIONS:   Get Medicines reviewed and adjusted: Please take all your medications with you for your next visit with your Primary MD   Laboratory/radiological data: Please request your Primary MD to go over all hospital tests and procedure/radiological results at the follow up, please ask your Primary  MD to get all Hospital records sent to his/her office.   In some cases, they will be blood work, cultures and biopsy results pending at the time of your discharge. Please request that your primary care M.D. goes through all the records of your hospital data and follows up on these results.   Also Note the following: If you experience worsening of your admission symptoms, develop shortness of breath, life threatening emergency, suicidal or homicidal thoughts you must seek medical attention immediately by calling 911 or calling your MD immediately  if symptoms less severe.   You must read complete instructions/literature along with all the possible adverse reactions/side effects for all the Medicines you take and that have been prescribed to you. Take any new Medicines after you have completely understood and accpet all the possible adverse reactions/side effects.    Do not drive when taking Pain medications or sleeping medications (Benzodaizepines)   Do not take more than prescribed Pain, Sleep and Anxiety Medications. It is not advisable to combine anxiety,sleep and pain medications without talking with your primary care practitioner   Special Instructions: If you have smoked or chewed Tobacco  in the last 2 yrs please stop smoking, stop any regular Alcohol  and or any Recreational drug use.   Wear Seat belts while driving.   Please note: You were cared for by a hospitalist during your hospital stay. Once you are discharged, your primary care physician will handle any further medical issues. Please note that NO REFILLS for any discharge medications will be authorized once you are discharged, as it is imperative that you return to your primary care physician (or establish a relationship with a primary care physician if you do not have one) for your post hospital discharge needs so that they can reassess your need for medications and monitor your lab values.  Time coordinating discharge: 40  minutes  SIGNED:  Marzetta Board, MD, PhD 11/21/2019, 10:16 AM

## 2019-11-21 NOTE — Progress Notes (Signed)
Physical Therapy Treatment Patient Details Name: Anthony Flowers MRN: [id number] DOB: 1960/10/14 Today's Date: 11/21/2019    History of Present Illness  Anthony Flowers is a 59 y.o. 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 Pt is NWB on L LE.    PT Comments    Pt demonstrates improved functional mobility with reduced guarding required today and continued maintance of NWB precautions. Pt reported no need to attempt stairs today, given his strength demonstrated yesterday, we focused on directions for CAM walker and donning/doffing for protection with car transfers. Pt verbalized understanding. He reported no further questions or concerns with functional mobility. No further PT required at this time.  Follow Up Recommendations  Follow surgeon's recommendation for DC plan and follow-up therapies     Equipment Recommendations  Crutches;Other (comment)    Recommendations for Other Services       Precautions / Restrictions Precautions Precautions: Fall Required Braces or Orthoses: Other Brace Other Brace: CAM walker Restrictions Weight Bearing Restrictions: Yes LLE Weight Bearing: Non weight bearing    Mobility  Bed Mobility Overal bed mobility: Modified Independent    Transfers Overall transfer level: Modified independent Equipment used: Crutches Transfers: Sit to/from Stand Sit to Stand: Supervision         General transfer comment: Supervision given today for safety for transfer from bed to chair to leave hospital  Ambulation/Gait Ambulation/Gait assistance: Supervision Gait Distance (Feet): 8 Feet Assistive device: Crutches Gait Pattern/deviations: Step-through pattern;Drifts right/left;Narrow base of support     General Gait Details: no LOB evident but side deviation mildly observed      Balance Overall  balance assessment: Needs assistance Sitting-balance support: No upper extremity supported Sitting balance-Leahy Scale: Normal     Standing balance support: Bilateral upper extremity supported Standing balance-Leahy Scale: Good Standing balance comment: Supervision required only today            Cognition Arousal/Alertness: Awake/alert Behavior During Therapy: WFL for tasks assessed/performed Overall Cognitive Status: Within Functional Limits for tasks assessed                Pertinent Vitals/Pain Pain Assessment: No/denies pain           PT Goals (current goals can now be found in the care plan section) Acute Rehab PT Goals Patient Stated Goal: Get boot on to go home PT Goal Formulation: With patient Time For Goal Achievement: 12/04/19 Potential to Achieve Goals: Good Progress towards PT goals: Progressing toward goals    Frequency    Min 3X/week      PT Plan Current plan remains appropriate       AM-PAC PT "6 Clicks" Mobility   Outcome Measure  Help needed turning from your back to your side while in a flat bed without using bedrails?: None Help needed moving from lying on your back to sitting on the side of a flat bed without using bedrails?: None Help needed moving to and from a bed to a chair (including a wheelchair)?: None Help needed standing up from a chair using your arms (e.g., wheelchair or bedside chair)?: None Help needed to walk in hospital room?: None Help needed climbing 3-5 steps with a railing? : A Little 6 Click Score: 23    End of Session   Activity Tolerance: Patient tolerated treatment well Patient left: Other (comment);with family/visitor present(transfer chair to  leave hospital) Nurse Communication: Mobility status PT Visit Diagnosis: Other abnormalities of gait and mobility (R26.89);Difficulty in walking, not elsewhere classified (R26.2)     Time: 1017-5102 PT Time Calculation (min) (ACUTE ONLY): 12 min  Charges:   $Therapeutic Activity: 8-22 mins                     Ann Held PT, DPT Acute Rehab [facility] P: [phone]    Anthony Flowers 11/21/2019, 12:26 PM

## 2019-11-21 NOTE — Progress Notes (Signed)
Pt IV removed and AVS reviewed, no questions. Given all necessary [paperwork and belongings gathered. Wife in room ready to take pt downstairs.

## 2019-11-21 NOTE — Discharge Instructions (Signed)
Follow with Dr Jacqualyn Posey as instructed  Please get a complete blood count and chemistry panel checked by your Primary MD at your next visit, and again as instructed by your Primary MD. Please get your medications reviewed and adjusted by your Primary MD.  Please request your Primary MD to go over all Hospital Tests and Procedure/Radiological results at the follow up, please get all Hospital records sent to your Prim MD by signing hospital release before you go home.  In some cases, there will be blood work, cultures and biopsy results pending at the time of your discharge. Please request that your primary care M.D. goes through all the records of your hospital data and follows up on these results.  If you had Pneumonia of Lung problems at the Hospital: Please get a 2 view Chest X ray done in 6-8 weeks after hospital discharge or sooner if instructed by your Primary MD.  If you have Congestive Heart Failure: Please call your Cardiologist or Primary MD anytime you have any of the following symptoms:  1) 3 pound weight gain in 24 hours or 5 pounds in 1 week  2) shortness of breath, with or without a dry hacking cough  3) swelling in the hands, feet or stomach  4) if you have to sleep on extra pillows at night in order to breathe  Follow cardiac low salt diet and 1.5 lit/day fluid restriction.  If you have diabetes Accuchecks 4 times/day, Once in AM empty stomach and then before each meal. Log in all results and show them to your primary doctor at your next visit. If any glucose reading is under 80 or above 300 call your primary MD immediately.  If you have Seizure/Convulsions/Epilepsy: Please do not drive, operate heavy machinery, participate in activities at heights or participate in high speed sports until you have seen by Primary MD or a Neurologist and advised to do so again. Per [facility] statutes, patients with seizures are not allowed to drive until they have been seizure-free  for six months.  Use caution when using heavy equipment or power tools. Avoid working on ladders or at heights. Take showers instead of baths. Ensure the water temperature is not too high on the home water heater. Do not go swimming alone. Do not lock yourself in a room alone (i.e. bathroom). When caring for infants or small children, sit down when holding, feeding, or changing them to minimize risk of injury to the child in the event you have a seizure. Maintain good sleep hygiene. Avoid alcohol.   If you had Gastrointestinal Bleeding: Please ask your Primary MD to check a complete blood count within one week of discharge or at your next visit. Your endoscopic/colonoscopic biopsies that are pending at the time of discharge, will also need to followed by your Primary MD.  Get Medicines reviewed and adjusted. Please take all your medications with you for your next visit with your Primary MD  Please request your Primary MD to go over all hospital tests and procedure/radiological results at the follow up, please ask your Primary MD to get all Hospital records sent to his/her office.  If you experience worsening of your admission symptoms, develop shortness of breath, life threatening emergency, suicidal or homicidal thoughts you must seek medical attention immediately by calling 911 or calling your MD immediately  if symptoms less severe.  You must read complete instructions/literature along with all the possible adverse reactions/side effects for all the Medicines you take and that  have been prescribed to you. Take any new Medicines after you have completely understood and accpet all the possible adverse reactions/side effects.   Do not drive or operate heavy machinery when taking Pain medications.   Do not take more than prescribed Pain, Sleep and Anxiety Medications  Special Instructions: If you have smoked or chewed Tobacco  in the last 2 yrs please stop smoking, stop any regular Alcohol  and or  any Recreational drug use.  Wear Seat belts while driving.  Please note You were cared for by a hospitalist during your hospital stay. If you have any questions about your discharge medications or the care you received while you were in the hospital after you are discharged, you can call the unit and asked to speak with the hospitalist on call if the hospitalist that took care of you is not available. Once you are discharged, your primary care physician will handle any further medical issues. Please note that NO REFILLS for any discharge medications will be authorized once you are discharged, as it is imperative that you return to your primary care physician (or establish a relationship with a primary care physician if you do not have one) for your aftercare needs so that they can reassess your need for medications and monitor your lab values.  You can reach the hospitalist office at phone [phone] or fax [phone]   If you do not have a primary care physician, you can call [phone] for a physician referral.  Activity: As tolerated with Full fall precautions use walker/cane & assistance as needed    Diet: diabetic  Disposition Home

## 2019-11-21 NOTE — Plan of Care (Signed)
  Problem: Education: Goal: Knowledge of General Education information will improve Description: Including pain rating scale, medication(s)/side effects and non-pharmacologic comfort measures 11/21/2019 1025 by Willia Craze, RN Outcome: Adequate for Discharge 11/21/2019 1024 by Willia Craze, RN Outcome: Progressing   Problem: Health Behavior/Discharge Planning: Goal: Ability to manage health-related needs will improve 11/21/2019 1025 by Willia Craze, RN Outcome: Adequate for Discharge 11/21/2019 1024 by Willia Craze, RN Outcome: Progressing   Problem: Clinical Measurements: Goal: Ability to maintain clinical measurements within normal limits will improve Outcome: Adequate for Discharge Goal: Will remain free from infection Outcome: Adequate for Discharge Goal: Diagnostic test results will improve Outcome: Adequate for Discharge Goal: Respiratory complications will improve Outcome: Adequate for Discharge Goal: Cardiovascular complication will be avoided Outcome: Adequate for Discharge   Problem: Activity: Goal: Risk for activity intolerance will decrease 11/21/2019 1025 by Willia Craze, RN Outcome: Adequate for Discharge 11/21/2019 1024 by Willia Craze, RN Outcome: Progressing   Problem: Nutrition: Goal: Adequate nutrition will be maintained 11/21/2019 1025 by Willia Craze, RN Outcome: Adequate for Discharge 11/21/2019 1024 by Willia Craze, RN Outcome: Progressing   Problem: Coping: Goal: Level of anxiety will decrease 11/21/2019 1025 by Willia Craze, RN Outcome: Adequate for Discharge 11/21/2019 1024 by Willia Craze, RN Outcome: Progressing   Problem: Elimination: Goal: Will not experience complications related to bowel motility Outcome: Adequate for Discharge Goal: Will not experience complications related to urinary retention Outcome: Adequate for Discharge   Problem: Pain Managment: Goal: General experience of comfort will improve 11/21/2019 1025 by  Willia Craze, RN Outcome: Adequate for Discharge 11/21/2019 1024 by Willia Craze, RN Outcome: Progressing   Problem: Safety: Goal: Ability to remain free from injury will improve Outcome: Adequate for Discharge   Problem: Skin Integrity: Goal: Risk for impaired skin integrity will decrease Outcome: Adequate for Discharge

## 2019-11-22 DIAGNOSIS — Z9181 History of falling: Secondary | ICD-10-CM | POA: Diagnosis not present

## 2019-11-22 DIAGNOSIS — Z89432 Acquired absence of left foot: Secondary | ICD-10-CM | POA: Diagnosis not present

## 2019-11-22 DIAGNOSIS — Z4801 Encounter for change or removal of surgical wound dressing: Secondary | ICD-10-CM | POA: Diagnosis not present

## 2019-11-22 DIAGNOSIS — E1122 Type 2 diabetes mellitus with diabetic chronic kidney disease: Secondary | ICD-10-CM | POA: Diagnosis not present

## 2019-11-23 LAB — CULTURE, BLOOD (ROUTINE X 2)
Culture: NO GROWTH
Culture: NO GROWTH
Special Requests: ADEQUATE
Special Requests: ADEQUATE

## 2019-11-24 LAB — AEROBIC/ANAEROBIC CULTURE W GRAM STAIN (SURGICAL/DEEP WOUND)

## 2019-11-25 ENCOUNTER — Encounter: Payer: Self-pay | Admitting: Podiatry

## 2019-11-25 ENCOUNTER — Ambulatory Visit: Payer: Medicare HMO

## 2019-11-25 ENCOUNTER — Telehealth: Payer: Self-pay | Admitting: *Deleted

## 2019-11-25 ENCOUNTER — Other Ambulatory Visit: Payer: Self-pay

## 2019-11-25 ENCOUNTER — Ambulatory Visit (INDEPENDENT_AMBULATORY_CARE_PROVIDER_SITE_OTHER): Payer: Medicare HMO | Admitting: Podiatry

## 2019-11-25 VITALS — BP 120/69 | HR 75 | Temp 97.2°F | Resp 14

## 2019-11-25 DIAGNOSIS — M8600 Acute hematogenous osteomyelitis, unspecified site: Secondary | ICD-10-CM

## 2019-11-25 DIAGNOSIS — L97522 Non-pressure chronic ulcer of other part of left foot with fat layer exposed: Secondary | ICD-10-CM

## 2019-11-25 DIAGNOSIS — Z89432 Acquired absence of left foot: Secondary | ICD-10-CM

## 2019-11-25 LAB — GLUCOSE, CAPILLARY: Glucose-Capillary: 25 mg/dL — CL (ref 70–99)

## 2019-11-25 NOTE — Telephone Encounter (Signed)
It is to clean the wound with wound cleaner. Pack with 1/4 inch iodoform packing. Paint a small amount of betaine to the incision and then cover with a dry dressing. Can be done every other day.   He is nonweightbearing.

## 2019-11-25 NOTE — Telephone Encounter (Signed)
Donita w/ [facility] is requesting order verification on patient. She stated that she is to go out to his house 1 time weekly for 5 weeks for left foot wound care/dressing changes. His wife stated that she will be able to do dressing changes on days that she is not there. Please confirm.

## 2019-11-25 NOTE — Telephone Encounter (Signed)
Faxed Dr. Leigh Aurora 11/25/2019 1:28pm orders to [facility].

## 2019-11-25 NOTE — Patient Instructions (Signed)
Continue a small amount of packing to the wound every other day and then cover with a dry dressing  Keep foot elevated  Continue antibiotics

## 2019-11-26 NOTE — Anesthesia Postprocedure Evaluation (Addendum)
Anesthesia Post Note  Patient: Anthony Flowers  Procedure(s) Performed: TRANSMETATARSAL AMPUTATION (Left Toe)     Patient location during evaluation: PACU Anesthesia Type: MAC Level of consciousness: awake Pain management: pain level controlled Vital Signs Assessment: post-procedure vital signs reviewed and stable Respiratory status: spontaneous breathing Cardiovascular status: stable Postop Assessment: no apparent nausea or vomiting Anesthetic complications: no    Last Vitals:  Vitals:   11/21/19 0343 11/21/19 0721  BP: (!) 142/85 136/61  Pulse: 78 73  Resp: 15 16  Temp: 36.8 C 36.8 C  SpO2: 98% 97%    Last Pain:  Vitals:   11/21/19 0721  TempSrc: Oral  PainSc: 0-No pain   Pain Goal: Patients Stated Pain Goal: 3 (11/21/19 0410)                 Huston Foley

## 2019-12-01 ENCOUNTER — Ambulatory Visit (INDEPENDENT_AMBULATORY_CARE_PROVIDER_SITE_OTHER): Payer: Medicare HMO | Admitting: Podiatry

## 2019-12-01 ENCOUNTER — Other Ambulatory Visit: Payer: Self-pay

## 2019-12-01 DIAGNOSIS — M8600 Acute hematogenous osteomyelitis, unspecified site: Secondary | ICD-10-CM

## 2019-12-01 DIAGNOSIS — L97522 Non-pressure chronic ulcer of other part of left foot with fat layer exposed: Secondary | ICD-10-CM

## 2019-12-01 MED ORDER — DOXYCYCLINE HYCLATE 100 MG PO TABS: 100.0000 mg | ORAL_TABLET | Freq: Two times a day (BID) | ORAL | 0 refills | Status: DC

## 2019-12-04 LAB — WOUND CULTURE
MICRO NUMBER:: 10352100
RESULT:: NO GROWTH
SPECIMEN QUALITY:: ADEQUATE

## 2019-12-05 NOTE — Progress Notes (Addendum)
Subjective: Anthony Flowers is a 59 y.o. is seen today in office s/p left foot TMA preformed on 11/19/2019.  He states he gets some discomfort at times.  He gets sharp pains of maximal Spaete/10 but is short.  He is complete the course of doxycycline.  Denies any redness or swelling.  Has not seen much drainage.  He has no fevers, chills, nausea, vomiting.  No calf pain, chest pain, shortness of breath. Denies any systemic complaints such as fevers, chills, nausea, vomiting. No calf pain, chest pain, shortness of breath.   Objective: General: No acute distress, AAOx3  DP/PT pulses palpable 2/4, CRT < 3 sec to all digits.  LEFT foot: Incision is well coapted without any evidence of dehiscence with sutures, staples intact in the central aspect is packed open.  There is fibrotic tissue present in the wound.  Is able to debride the wound down to healthy, viable, bleeding tissue.  Does probe the metatarsal.  There is no malodor.  There is mild swelling but there is no significant erythema or warmth.  Wound after debridement measures 1.4 x 0.8 x 0.5 cm and is full-thickness. No other areas of tenderness to bilateral lower extremities.  No other open lesions or pre-ulcerative lesions.  No pain with calf compression, swelling, warmth, erythema.   Assessment and Plan:  Status post left foot transmetatarsal imitation  -Treatment options discussed including all alternatives, risks, and complications -I did change the bandage today.  I utilized [phone] with scalpel as well as a tissue nipper to sharply debride the wound with the areas packed open to remove fibrotic tissue. Wound culture obtained.  After debridement the wound was viable with granular tissue.  However does probe to bone.  Irrigated wound with saline.  Repacked the wound.  Also order silver alginate that he can use to help the wound.  Will restart antibiotics and prescribe doxycycline.  Continue cam boot, elevation. -Monitor for any clinical signs or  symptoms of infection and DVT/PE and directed to call the office immediately should any occur or go to the ER. -Follow-up as scheduled or sooner if any problems arise. In the meantime, encouraged to call the office with any questions, concerns, change in symptoms.   Celesta Gentile, DPM

## 2019-12-05 NOTE — Progress Notes (Signed)
Subjective: Anthony Flowers is a 59 y.o. is seen today in office s/p left foot TMA preformed on 11/19/2019.  States he is doing well.  She is discharged from the hospital yesterday.  He is on antibiotics.  Is been wearing a cam boot trying keep the foot elevated.  Denies any systemic complaints such as fevers, chills, nausea, vomiting. No calf pain, chest pain, shortness of breath.   Objective: General: No acute distress, AAOx3  DP/PT pulses palpable 2/4, CRT < 3 sec to all digits.  LEFT foot: Incision is well coapted without any evidence of dehiscence with sutures, staples intact in the central aspect is packed open.  Upon removal of the packing there is no purulence.  Mild swelling of the foot there is no significant erythema there is no warmth.  There is no fluctuation crepitation.  There is no malodor.  No other areas of tenderness to bilateral lower extremities.  No other open lesions or pre-ulcerative lesions.  No pain with calf compression, swelling, warmth, erythema.   Assessment and Plan:  Status post left foot transmetatarsal imitation, doing well with no complications   -Treatment options discussed including all alternatives, risks, and complications -Dressing was changed today.  I irrigated the wound with saline and was repacked with iodoform packing.  Betadine was painted over the incision followed by dry sterile dressing.  He does have home health set up.  His wife is also comfortable changing the bandage. -Continue cam boot and limit weightbearing. -Elevation -Pain medication as needed. -Monitor for any clinical signs or symptoms of infection and DVT/PE and directed to call the office immediately should any occur or go to the ER. -Follow-up as scheduled or sooner if any problems arise. In the meantime, encouraged to call the office with any questions, concerns, change in symptoms.   Celesta Gentile, DPM

## 2019-12-08 ENCOUNTER — Ambulatory Visit (INDEPENDENT_AMBULATORY_CARE_PROVIDER_SITE_OTHER): Payer: Medicare HMO | Admitting: Podiatry

## 2019-12-08 ENCOUNTER — Telehealth: Payer: Self-pay | Admitting: *Deleted

## 2019-12-08 ENCOUNTER — Other Ambulatory Visit: Payer: Self-pay

## 2019-12-08 ENCOUNTER — Encounter: Payer: Self-pay | Admitting: Podiatry

## 2019-12-08 VITALS — Temp 97.7°F

## 2019-12-08 DIAGNOSIS — L97522 Non-pressure chronic ulcer of other part of left foot with fat layer exposed: Secondary | ICD-10-CM

## 2019-12-08 DIAGNOSIS — M8600 Acute hematogenous osteomyelitis, unspecified site: Secondary | ICD-10-CM

## 2019-12-08 NOTE — Telephone Encounter (Signed)
Faxed required form, clinicals and demographics to [employer].

## 2019-12-08 NOTE — Telephone Encounter (Signed)
Prepared required form, and demographics to be faxed to [facility] once 12/08/2019 clinicals are available.

## 2019-12-08 NOTE — Progress Notes (Signed)
Subjective: Anthony Flowers is a 59 y.o. is seen today in office s/p left foot TMA preformed on 11/19/2019.  His wife has been doing dressing changes daily.  They have continue to pack the wound.  Denies any pus but get some bloody drainage at times.  Denies any fevers, chills, nausea, vomiting.  Mild discomfort.  No shortness of breath or calf pain or chest pain.  Objective: General: No acute distress, AAOx3  DP/PT pulses palpable 2/4, CRT < 3 sec to all digits.  LEFT foot: Incision is well coapted without any evidence of dehiscence with sutures, staples intact however the central aspect it was packed open still has a wound measurement the same is 1.5 x 0.7 x 0.5 cm full-thickness there is 1 small area that probes to bone.  There is mild edema but there is no erythema or warmth.  There is no fluctuation crepitation.  There is no malodor. No other areas of tenderness to bilateral lower extremities.  No other open lesions or pre-ulcerative lesions.  No pain with calf compression, swelling, warmth, erythema.   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  No signs of an abscess.  Wound culture was reviewed.  I would order a snap VAC to apply to the wound.  For now continue with daily dressing changes with iodoform.  I ordered silver alginate as well but not yet received this. -Continue cam boot and limit weightbearing. -Finish course of antibiotics -Monitor for any clinical signs or symptoms of infection and directed to call the office immediately should any occur or go to the ER.  Return in about 1 week (around 12/15/2019).  Trula Slade DPM

## 2019-12-08 NOTE — Telephone Encounter (Signed)
The note is already done!

## 2019-12-08 NOTE — Telephone Encounter (Signed)
No worries :)

## 2019-12-10 NOTE — Telephone Encounter (Addendum)
I informed pt of the cost from the [facility] Vac benefits report and informed the other Wound Vac option may have an Assistance Program. Pt asked to have alternate wound vac process started.

## 2019-12-10 NOTE — Telephone Encounter (Signed)
Received IVR 12/08/2019 for SNAP Vac. I had V. [place] - Psychologist, counselling review, she states pt will only have to pay $25.00/application visit, and Medicaid will not cover remaining. Dr. Jacqualyn Posey states inform pt.

## 2019-12-11 NOTE — Telephone Encounter (Signed)
Prepared 6 months of clinicals including 12/08/2019, required forms and demogrraphics to be faxed to [facility], once [employer] form is completed by Dr. Jacqualyn Posey.

## 2019-12-18 ENCOUNTER — Ambulatory Visit (INDEPENDENT_AMBULATORY_CARE_PROVIDER_SITE_OTHER): Payer: Medicare HMO | Admitting: Podiatry

## 2019-12-18 ENCOUNTER — Encounter: Payer: Self-pay | Admitting: Podiatry

## 2019-12-18 ENCOUNTER — Other Ambulatory Visit: Payer: Self-pay

## 2019-12-18 VITALS — Temp 97.6°F

## 2019-12-18 DIAGNOSIS — Z89432 Acquired absence of left foot: Secondary | ICD-10-CM

## 2019-12-18 DIAGNOSIS — L97522 Non-pressure chronic ulcer of other part of left foot with fat layer exposed: Secondary | ICD-10-CM

## 2019-12-18 MED ORDER — CEPHALEXIN 500 MG PO CAPS: 500.0000 mg | ORAL_CAPSULE | Freq: Three times a day (TID) | ORAL | 0 refills | Status: DC

## 2019-12-19 NOTE — Telephone Encounter (Signed)
Left message informing [employer] - Anthony Flowers the requested information had been faxed yesterday.

## 2019-12-19 NOTE — Telephone Encounter (Signed)
[facility] states [employer] wound vac are on back order and requested clinicals, demographics and codes to be faxed.

## 2019-12-23 ENCOUNTER — Encounter: Payer: Self-pay | Admitting: Podiatry

## 2019-12-23 ENCOUNTER — Ambulatory Visit (INDEPENDENT_AMBULATORY_CARE_PROVIDER_SITE_OTHER): Payer: Medicare HMO

## 2019-12-23 ENCOUNTER — Ambulatory Visit (INDEPENDENT_AMBULATORY_CARE_PROVIDER_SITE_OTHER): Payer: Medicare HMO | Admitting: Podiatry

## 2019-12-23 ENCOUNTER — Other Ambulatory Visit: Payer: Self-pay

## 2019-12-23 DIAGNOSIS — T8130XA Disruption of wound, unspecified, initial encounter: Secondary | ICD-10-CM | POA: Diagnosis not present

## 2019-12-23 DIAGNOSIS — L97522 Non-pressure chronic ulcer of other part of left foot with fat layer exposed: Secondary | ICD-10-CM

## 2019-12-23 MED ORDER — CEPHALEXIN 500 MG PO CAPS: 500.0000 mg | ORAL_CAPSULE | Freq: Three times a day (TID) | ORAL | 0 refills | Status: DC

## 2019-12-23 MED ORDER — HYDROCODONE-ACETAMINOPHEN 5-325 MG PO TABS: 1.0000 | ORAL_TABLET | Freq: Four times a day (QID) | ORAL | 0 refills | Status: DC | PRN

## 2019-12-23 NOTE — Patient Instructions (Signed)

## 2019-12-23 NOTE — Progress Notes (Signed)
Subjective: Anthony Flowers is a 59 y.o. is seen today in office s/p left foot TMA preformed on 11/19/2019.  His wife has been doing dressing changes daily.  There are drainage coming from the wound but denies any pus.  They are concerned because the home health care nurse would pack the wound very tightly.  The swelling has improved goes but there is no redness or warmth.  Overall he feels well and denies any fevers.  No chest pain, shortness of breath or chest pain.  Objective: General: No acute distress, AAOx3  DP/PT pulses palpable 2/4, CRT < 3 sec to all digits.  LEFT foot: Along the edges of the incision is well coapted with staples intact to the central aspect has 2 openings and there is 1 area that probes to bone.  There is some clear drainage expressed there is no purulence.  Mild swelling but there is no erythema or warmth.  There is no fluctuation or crepitation.  There is no malodor.  The wound overall is larger but there is new dehiscence and total measurement measures 5 x 2.5 x 0.7 cm.  Previously it was just one area was located but now the 2 areas combine makes for a larger wound. No other open lesions or pre-ulcerative lesions.  No pain with calf compression, swelling, warmth, erythema.   Assessment and Plan:  Status post left foot transmetatarsal imitation with dehiscence  -Treatment options discussed including all alternatives, risks, and complications -X-rays obtained reviewed.  There is some edema present all metatarsal concerning for possible osteomyelitis.  Given the wound does probe to bone the wound has opened I recommend revision of the transmetatarsal imitation with wound debridement and possible VAC application.  He understands this and he wishes to proceed.  We will plan to this next week.  Discussed risks of surgery including, but not limited to, delayed or nonhealing, spread of infection, scarring, need for further surgery, loss of leg, stroke, heart attack,  death. -Remain on antibiotics for now.  Is on Keflex.  Encouraged elevation and try to stay off the foot as much as possible.  He has a lot of personal issues going on right now he is having to move.  No follow-ups on file.  Trula Slade DPM

## 2019-12-24 NOTE — Telephone Encounter (Signed)
Dr. Jacqualyn Posey stated he signed pt for surgery, stop processing the wound vac at this time.

## 2019-12-29 ENCOUNTER — Other Ambulatory Visit (HOSPITAL_COMMUNITY)
Admission: RE | Admit: 2019-12-29 | Discharge: 2019-12-29 | Disposition: A | Discharge status: Home / Self Care | Payer: Medicare HMO | Source: Ambulatory Visit | Attending: Podiatry | Admitting: Podiatry

## 2019-12-29 DIAGNOSIS — Z20822 Contact with and (suspected) exposure to covid-19: Secondary | ICD-10-CM | POA: Insufficient documentation

## 2019-12-29 DIAGNOSIS — Z01812 Encounter for preprocedural laboratory examination: Secondary | ICD-10-CM | POA: Insufficient documentation

## 2019-12-29 LAB — SARS CORONAVIRUS 2 (TAT 6-24 HRS): SARS Coronavirus 2: NEGATIVE

## 2019-12-30 ENCOUNTER — Ambulatory Visit (INDEPENDENT_AMBULATORY_CARE_PROVIDER_SITE_OTHER): Payer: Medicare HMO | Admitting: Podiatry

## 2019-12-30 ENCOUNTER — Telehealth: Payer: Self-pay | Admitting: *Deleted

## 2019-12-30 ENCOUNTER — Ambulatory Visit (INDEPENDENT_AMBULATORY_CARE_PROVIDER_SITE_OTHER): Payer: Medicare HMO

## 2019-12-30 ENCOUNTER — Other Ambulatory Visit: Payer: Self-pay

## 2019-12-30 VITALS — Temp 97.7°F

## 2019-12-30 DIAGNOSIS — M8600 Acute hematogenous osteomyelitis, unspecified site: Secondary | ICD-10-CM

## 2019-12-30 DIAGNOSIS — L97522 Non-pressure chronic ulcer of other part of left foot with fat layer exposed: Secondary | ICD-10-CM

## 2019-12-30 DIAGNOSIS — M86072 Acute hematogenous osteomyelitis, left ankle and foot: Secondary | ICD-10-CM

## 2019-12-30 MED ORDER — CEPHALEXIN 500 MG PO CAPS: 500.0000 mg | ORAL_CAPSULE | Freq: Three times a day (TID) | ORAL | 0 refills | Status: DC

## 2019-12-30 NOTE — Telephone Encounter (Signed)
Anthony Flowers states next scheduling availability is 01/08/2020.

## 2019-12-30 NOTE — Telephone Encounter (Signed)
Anthony Flowers - [facility] states they have Saturday appts available.

## 2019-12-30 NOTE — Telephone Encounter (Signed)
[employer] - [facility] (10:40am) states authorization will come from [employer] [phone], [employer] (10:44am) states [facility] Morita - [phone] is in-network AUTHORIZATION:  [id number] VALID 12/30/2019 - 01/29/2020 FOR MRI LEFT FOOT 73718, DX M86.00. faxed orders to [facility]. I informed pt of the location of [facility] [phone] and [address] [place], [place].

## 2019-12-30 NOTE — Telephone Encounter (Signed)
Left message for [facility] to call with information concerning STAT left foot MRI this week.

## 2019-12-30 NOTE — Telephone Encounter (Signed)
[facility] states the MRIs are being scheduled 2nd week in June.

## 2019-12-30 NOTE — Progress Notes (Signed)
Subjective: Anthony Flowers is a 59 y.o. is seen today in office s/p left foot TMA preformed on 11/19/2019. He states that he is doing well and the wounds have been healing better since last appointment. He is scheduled for surgery tomorrow but his wife is asking if he still needs to have surgery. He has continued on keflex. He has been doing well with this and not having any side affects. Still some swelling but improved. Denies any fevers.  No chest pain, shortness of breath or chest pain.  Objective: General: No acute distress, AAOx3  DP/PT pulses palpable 2/4, CRT < 3 sec to all digits.  LEFT foot: Incision both the medial lateral aspects are healing well.  The central aspect wound is about the same in diameter however it has filled and there is currently no probing to bone there is some clear drainage expressed there is no purulence.  There is no surrounding erythema.  Still some mild swelling but has not worsened since last appointment.  There is no fluctuation or crepitation.  There is no malodor. No other open lesions or pre-ulcerative lesions.  No pain with calf compression, swelling, warmth, erythema.   Assessment and Plan:  Status post left foot transmetatarsal imitation with dehiscence  -Treatment options discussed including all alternatives, risks, and complications -X-rays again obtained and reviewed.  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  No soft tissue emphysema. -The wound is doing better although there is osteomyelitis in the fifth metatarsal.  Still recommend revisional transmetatarsal imitation.  However the wound is doing better in the foot and there is a stable I will delay surgery till next week and would order an MRI to further evaluate the foot.  Continue Keflex.  Check blood work which I ordered today.  Encouraged elevation staff the foot is much as possible.  Discussed there is any worsening infection to report immediately to the emergency  department.  They are in agreement to this and no further questions or concerns.  No follow-ups on file.  Trula Slade DPM

## 2019-12-30 NOTE — Telephone Encounter (Signed)
[facility] states they do not have any availability this week.

## 2019-12-30 NOTE — Telephone Encounter (Signed)
[facility] - Anderson Malta states if I fax to her as a STAT she will make sure pt is scheduled for this week.

## 2020-01-01 LAB — COMPLETE METABOLIC PANEL WITHOUT GFR
AG Ratio: 0.8 (calc) — ABNORMAL LOW (ref 1.0–2.5)
ALT: 8 U/L — ABNORMAL LOW (ref 9–46)
AST: 15 U/L (ref 10–35)
Albumin: 3.7 g/dL (ref 3.6–5.1)
Alkaline phosphatase (APISO): 123 U/L (ref 35–144)
BUN/Creatinine Ratio: 14 (calc) (ref 6–22)
BUN: 32 mg/dL — ABNORMAL HIGH (ref 7–25)
CO2: 20 mmol/L (ref 20–32)
Calcium: 10.1 mg/dL (ref 8.6–10.3)
Chloride: 99 mmol/L (ref 98–110)
Creat: 2.36 mg/dL — ABNORMAL HIGH (ref 0.70–1.33)
GFR, Est African American: 34 mL/min/1.73m2 — ABNORMAL LOW (ref >=60)
GFR, Est Non African American: 29 mL/min/1.73m2 — ABNORMAL LOW (ref >=60)
Globulin: 4.9 g/dL — ABNORMAL HIGH (ref 1.9–3.7)
Glucose, Bld: 316 mg/dL — ABNORMAL HIGH (ref 65–139)
Potassium: 5.3 mmol/L (ref 3.5–5.3)
Sodium: 130 mmol/L — ABNORMAL LOW (ref 135–146)
Total Bilirubin: 0.5 mg/dL (ref 0.2–1.2)
Total Protein: 8.6 g/dL — ABNORMAL HIGH (ref 6.1–8.1)

## 2020-01-01 LAB — CBC WITH DIFFERENTIAL/PLATELET
Absolute Monocytes: 672 {cells}/uL (ref 200–950)
Basophils Absolute: 51 {cells}/uL (ref 0–200)
Basophils Relative: 0.6 %
Eosinophils Absolute: 179 {cells}/uL (ref 15–500)
Eosinophils Relative: 2.1 %
HCT: 32.5 % — ABNORMAL LOW (ref 38.5–50.0)
Hemoglobin: 9.8 g/dL — ABNORMAL LOW (ref 13.2–17.1)
Lymphs Abs: 1938 {cells}/uL (ref 850–3900)
MCH: 22.6 pg — ABNORMAL LOW (ref 27.0–33.0)
MCHC: 30.2 g/dL — ABNORMAL LOW (ref 32.0–36.0)
MCV: 74.9 fL — ABNORMAL LOW (ref 80.0–100.0)
MPV: 9.8 fL (ref 7.5–12.5)
Monocytes Relative: 7.9 %
Neutro Abs: 5661 {cells}/uL (ref 1500–7800)
Neutrophils Relative %: 66.6 %
Platelets: 703 Thousand/uL — ABNORMAL HIGH (ref 140–400)
RBC: 4.34 Million/uL (ref 4.20–5.80)
RDW: 15.5 % — ABNORMAL HIGH (ref 11.0–15.0)
Total Lymphocyte: 22.8 %
WBC: 8.5 Thousand/uL (ref 3.8–10.8)

## 2020-01-01 LAB — SEDIMENTATION RATE: Sed Rate: 116 mm/h — ABNORMAL HIGH (ref 0–20)

## 2020-01-01 LAB — C-REACTIVE PROTEIN: CRP: 34.8 mg/L — ABNORMAL HIGH (ref <8.0)

## 2020-01-02 ENCOUNTER — Encounter (HOSPITAL_COMMUNITY)
Admission: RE | Admit: 2020-01-02 | Discharge: 2020-01-02 | Disposition: A | Discharge status: Home / Self Care | Payer: Medicare HMO | Source: Ambulatory Visit | Attending: Podiatry | Admitting: Podiatry

## 2020-01-02 ENCOUNTER — Other Ambulatory Visit (HOSPITAL_COMMUNITY)
Admission: RE | Admit: 2020-01-02 | Discharge: 2020-01-02 | Disposition: A | Discharge status: Home / Self Care | Payer: Medicare HMO | Source: Ambulatory Visit | Attending: Podiatry | Admitting: Podiatry

## 2020-01-02 ENCOUNTER — Other Ambulatory Visit: Payer: Self-pay

## 2020-01-02 ENCOUNTER — Encounter (HOSPITAL_COMMUNITY): Payer: Self-pay

## 2020-01-02 DIAGNOSIS — Z01812 Encounter for preprocedural laboratory examination: Secondary | ICD-10-CM | POA: Insufficient documentation

## 2020-01-02 DIAGNOSIS — I129 Hypertensive chronic kidney disease with stage 1 through stage 4 chronic kidney disease, or unspecified chronic kidney disease: Secondary | ICD-10-CM | POA: Insufficient documentation

## 2020-01-02 DIAGNOSIS — E11621 Type 2 diabetes mellitus with foot ulcer: Secondary | ICD-10-CM | POA: Insufficient documentation

## 2020-01-02 DIAGNOSIS — L97529 Non-pressure chronic ulcer of other part of left foot with unspecified severity: Secondary | ICD-10-CM | POA: Insufficient documentation

## 2020-01-02 DIAGNOSIS — E785 Hyperlipidemia, unspecified: Secondary | ICD-10-CM | POA: Insufficient documentation

## 2020-01-02 DIAGNOSIS — Z20822 Contact with and (suspected) exposure to covid-19: Secondary | ICD-10-CM | POA: Insufficient documentation

## 2020-01-02 DIAGNOSIS — E1122 Type 2 diabetes mellitus with diabetic chronic kidney disease: Secondary | ICD-10-CM | POA: Insufficient documentation

## 2020-01-02 DIAGNOSIS — N184 Chronic kidney disease, stage 4 (severe): Secondary | ICD-10-CM | POA: Insufficient documentation

## 2020-01-02 DIAGNOSIS — Z79899 Other long term (current) drug therapy: Secondary | ICD-10-CM | POA: Insufficient documentation

## 2020-01-02 DIAGNOSIS — Z794 Long term (current) use of insulin: Secondary | ICD-10-CM | POA: Insufficient documentation

## 2020-01-02 LAB — BASIC METABOLIC PANEL WITH GFR
Anion gap: 10 (ref 5–15)
BUN: 31 mg/dL — ABNORMAL HIGH (ref 6–20)
CO2: 23 mmol/L (ref 22–32)
Calcium: 9.3 mg/dL (ref 8.9–10.3)
Chloride: 95 mmol/L — ABNORMAL LOW (ref 98–111)
Creatinine, Ser: 2.33 mg/dL — ABNORMAL HIGH (ref 0.61–1.24)
GFR calc Af Amer: 34 mL/min — ABNORMAL LOW (ref >60)
GFR calc non Af Amer: 29 mL/min — ABNORMAL LOW (ref >60)
Glucose, Bld: 256 mg/dL — ABNORMAL HIGH (ref 70–99)
Potassium: 5.1 mmol/L (ref 3.5–5.1)
Sodium: 128 mmol/L — ABNORMAL LOW (ref 135–145)

## 2020-01-02 LAB — SARS CORONAVIRUS 2 (TAT 6-24 HRS): SARS Coronavirus 2: NEGATIVE

## 2020-01-02 LAB — HEMOGLOBIN A1C
Hgb A1c MFr Bld: 8.8 % — ABNORMAL HIGH (ref 4.8–5.6)
Mean Plasma Glucose: 205.86 mg/dL

## 2020-01-02 LAB — CBC
HCT: 33.8 % — ABNORMAL LOW (ref 39.0–52.0)
Hemoglobin: 10 g/dL — ABNORMAL LOW (ref 13.0–17.0)
MCH: 22.3 pg — ABNORMAL LOW (ref 26.0–34.0)
MCHC: 29.6 g/dL — ABNORMAL LOW (ref 30.0–36.0)
MCV: 75.3 fL — ABNORMAL LOW (ref 80.0–100.0)
Platelets: 691 K/uL — ABNORMAL HIGH (ref 150–400)
RBC: 4.49 MIL/uL (ref 4.22–5.81)
RDW: 16.7 % — ABNORMAL HIGH (ref 11.5–15.5)
WBC: 10.1 K/uL (ref 4.0–10.5)
nRBC: 0 % (ref 0.0–0.2)

## 2020-01-02 LAB — GLUCOSE, CAPILLARY: Glucose-Capillary: 239 mg/dL — ABNORMAL HIGH (ref 70–99)

## 2020-01-02 NOTE — Progress Notes (Signed)
PCP - Dr. Doran Durand Cardiologist - N/A  Chest x-ray - greater than 1 year EKG - 11/24/19 in epic Stress Test - greater than 2 years ECHO -  greater than 2 years Cardiac Cath - N/A  Sleep Study - N/A CPAP - N/A  Fasting Blood Sugar - 150-200 Checks Blood Sugar __4-5___ times a day  Blood Thinner Instructions: N/A Aspirin Instructions: Instructed to contact surgeon Last Dose: currently taking  Anesthesia review: N/A  Patient denies shortness of breath, fever, cough and chest pain at PAT appointment   Patient verbalized understanding of instructions that were given to them at the PAT appointment. Patient was also instructed that they will need to review over the PAT instructions again at home before surgery.

## 2020-01-02 NOTE — Patient Instructions (Addendum)
DUE TO COVID-19 ONLY ONE VISITOR IS ALLOWED TO COME WITH YOU AND STAY IN THE WAITING ROOM ONLY DURING PRE OP AND PROCEDURE DAY OF SURGERY. THE 1 VISITOR MAY VISIT WITH YOU AFTER SURGERY IN YOUR PRIVATE ROOM DURING VISITING HOURS ONLY!  YOU NEED TO HAVE A COVID 19 TEST ON Friday May 14  @__2 :45PM __, THIS TEST MUST BE DONE BEFORE SURGERY, COME  [address], [place] , [address].  ([place]) ONCE YOUR COVID TEST IS COMPLETED, PLEASE BEGIN THE QUARANTINE INSTRUCTIONS AS OUTLINED IN YOUR HANDOUT.                Louis Matte    Your procedure is scheduled on: 01/05/20   Report to [facility] Main  Entrance Report to admitting at 10:15 AM    Call this number if you have problems the morning of surgery [phone]    Remember: Do not eat food or drink liquids after [place], NO CHEWING GUM [place].    Take these medicines the morning of surgery with A SIP OF WATER: Carvedilol, Amlodipine, Omeprazole    DO NOT TAKE ANY DIABETIC MEDICATIONS DAY OF YOUR SURGERY     How to Manage Your Diabetes Before and After Surgery  Why is it important to control my blood sugar before and after surgery? . Improving blood sugar levels before and after surgery helps healing and can limit problems. . A way of improving blood sugar control is eating a healthy diet by: o  Eating less sugar and carbohydrates o  Increasing activity/exercise o  Talking with your doctor about reaching your blood sugar goals . High blood sugars (greater than 180 mg/dL) can raise your risk of infections and slow your recovery, so you will need to focus on controlling your diabetes during the weeks before surgery. . Make sure that the doctor who takes care of your diabetes knows about your planned surgery including the date and location.  How do I manage my blood sugar before surgery? . Check your blood sugar at least 4 times a  day, starting 2 days before surgery, to make sure that the level is not too high or low. o Check your blood sugar the morning of your surgery when you wake up and every 2 hours until you get to the Short Stay unit. . If your blood sugar is less than 70 mg/dL, you will need to treat for low blood sugar: o Do not take insulin. o Treat a low blood sugar (less than 70 mg/dL) with  cup of clear juice (cranberry or apple), 4 glucose tablets, OR glucose gel. o Recheck blood sugar in 15 minutes after treatment (to make sure it is greater than 70 mg/dL). If your blood sugar is not greater than 70 mg/dL on recheck, call [phone] for further instructions. . Report your blood sugar to the short stay nurse when you get to Short Stay.  . If you are admitted to the hospital after surgery: o Your blood sugar will be checked by the staff and you will probably be given insulin after surgery (instead of oral diabetes medicines) to make sure you have good blood sugar levels. o The goal for blood sugar control after surgery is 80-180 mg/dL.   WHAT DO I DO ABOUT MY DIABETES MEDICATION?  Marland Kitchen Do not take oral diabetes medicines (pills) the morning of surgery.  . THE NIGHT  BEFORE SURGERY, take 21    units of Lantus      insulin.       . THE MORNING OF SURGERY, take 21 units of Lantus  insulin.  . The day of surgery, do not take other diabetes injectables, including Byetta (exenatide), Bydureon (exenatide ER), Victoza (liraglutide), or Trulicity (dulaglutide).  . If your CBG is greater than 220 mg/dL, you may take  of your sliding scale  . (correction) dose of insulin.                 You may not have any metal on your body including              piercings  Do not wear jewelry, lotions, powders or deodorant                        Men may shave face and neck.   Do not bring valuables to the hospital. [place].  Contacts, dentures or bridgework may not be  worn into surgery.    Patients discharged the day of surgery will not be allowed to drive home  IF YOU ARE HAVING SURGERY AND GOING HOME THE SAME DAY, YOU MUST HAVE AN ADULT TO DRIVE YOU HOME AND BE WITH YOU FOR 24 HOURS.   YOU MAY GO HOME BY TAXI OR UBER OR ORTHERWISE, BUT AN ADULT MUST ACCOMPANY YOU HOME AND STAY WITH YOU FOR 24 HOURS.  Name and phone number of your driver:  Special Instructions: N/A              Please read over the following fact sheets you were given: _____________________________________________________________________   [facility] - Preparing for Surgery Before surgery, you can play an important role.  Because skin is not sterile, your skin needs to be as free of germs as possible.  You can reduce the number of germs on your skin by washing with CHG (chlorahexidine gluconate) soap before surgery.  CHG is an antiseptic cleaner which kills germs and bonds with the skin to continue killing germs even after washing. Please DO NOT use if you have an allergy to CHG or antibacterial soaps.  If your skin becomes reddened/irritated stop using the CHG and inform your nurse when you arrive at Short Stay. Do not shave (including legs and underarms) for at least 48 hours prior to the first CHG shower.  You may shave your face/neck.  Please follow these instructions carefully:  1.  Shower with CHG or DialSoap the night before surgery and the  morning of surgery.  2.  If you choose to wash your hair, wash your hair first as usual with your normal  shampoo.  3.  After you shampoo, rinse your hair and body thoroughly to remove the shampoo.                             4.  Use CHGn or dial as you would any other liquid soap.  You can apply chg directly to the skin and wash.                 Gently with a scrungie or clean washcloth.  5.  Apply the CHG Soap to your body ONLY FROM THE NECK DOWN.   Do  not use on face/ open  Wound or open sores. Avoid contact with  eyes, ears mouth and genitals (private parts).                       Wash face,  Genitals (private parts) with your normal soap.             6.  Wash thoroughly, paying special attention to the area where your surgery  will be performed.  7.  Thoroughly rinse your body with warm water from the neck down.  8.  DO NOT shower/wash with your normal soap after using and rinsing off the CHG Soap.             9.  Pat yourself dry with a clean towel.            10.  Wear clean pajamas.            11.  Place clean sheets on your bed the night of your first shower and do not  sleep with pets. Day of Surgery : Do not apply any lotions/deodorants the morning of surgery.  Please wear clean clothes to the hospital/surgery center.  FAILURE TO FOLLOW THESE INSTRUCTIONS MAY RESULT IN THE CANCELLATION OF YOUR SURGERY  PATIENT SIGNATURE_________________________________  NURSE SIGNATURE__________________________________  ________________________________________________________________________

## 2020-01-02 NOTE — Progress Notes (Signed)
Left voicemail for Anthony Flowers at Dr. Leigh Aurora office to request H&P on Mr. Anthony Flowers from PCP.

## 2020-01-03 ENCOUNTER — Other Ambulatory Visit (HOSPITAL_COMMUNITY): Payer: Medicare HMO

## 2020-01-03 ENCOUNTER — Ambulatory Visit (INDEPENDENT_AMBULATORY_CARE_PROVIDER_SITE_OTHER): Payer: Medicare HMO

## 2020-01-03 DIAGNOSIS — M86072 Acute hematogenous osteomyelitis, left ankle and foot: Secondary | ICD-10-CM

## 2020-01-03 NOTE — Progress Notes (Signed)
Reviewed the MRI. Given the infection and he has been on oral antibiotics I have recommended going to the Emergency Room. He is already scheduled for surgery on Monday at [facility]. I would like for him to get started on IV antibiotics. I spoke to the patients wife and he was in the background. They are going to [facility] ED.   Spoke to the ER. I will follow the patient while admitted but will need to be admitted to the hospitalist service.   I will see the patient tomorrow morning.

## 2020-01-04 ENCOUNTER — Encounter: Payer: Self-pay | Admitting: Podiatry

## 2020-01-04 MED ORDER — VANCOMYCIN HCL 1500 MG/300ML IV SOLN
1500.0000 mg | INTRAVENOUS | Status: AC
  Administered 2020-01-05: 1500 mg via INTRAVENOUS
  Filled 2020-01-04: qty 300

## 2020-01-04 NOTE — Progress Notes (Unsigned)
I called the patient as he did not go to the ER as directed yesterday. Upon speaking to him he states that he went to the [facility] and they would not admit him. I do not see any documentation from there. Patient was apparently told that there was a wait in the ER to be seen and he didn't want to wait so he went home. I had spoken to the ER to let them know of his arrival yesterday and his wife also called the ER at [facility] and they were on the phone at the same time as I was speaking to them.  He is feeling well and denies any fevers, chills, increase in pain. No increase in swelling or drainage to the foot. They feel that the wound is healing.   I discussed the MRI results and the surgery for tomorrow and why I wanted him admitted. Will remove the remaining metatarsals, I&D, bone biopsy of the concerning bones. I discussed this with him and he understands. I would like for him to be in the hospital but doesn't want to wait. He has also stated he does not want to go to the hospital because he does not want another COVID test and he does not want to be "poked and prodded" with getting blood work. He wants to come in as scheduled for surgery tomorrow because he feels well and the foot is looking good.   I will admit him post-op tomorrow but told him if there is ANY changes he has to go to the ER. He  verbally understood.   Celesta Gentile, DPM O: [phone] C: [phone]

## 2020-01-05 ENCOUNTER — Encounter (HOSPITAL_COMMUNITY)
Admission: RE | Admit: 2020-01-05 | Discharge: 2020-01-05 | Disposition: A | Discharge status: Home / Self Care | Payer: Medicare HMO | Source: Ambulatory Visit | Attending: Podiatry | Admitting: Podiatry

## 2020-01-05 ENCOUNTER — Encounter (HOSPITAL_COMMUNITY): Payer: Medicare HMO

## 2020-01-05 ENCOUNTER — Other Ambulatory Visit: Payer: Self-pay

## 2020-01-05 ENCOUNTER — Encounter (HOSPITAL_COMMUNITY)
Admission: AD | Disposition: A | Discharge status: Home / Self Care | Payer: Self-pay | Source: Home / Self Care | Attending: Internal Medicine

## 2020-01-05 ENCOUNTER — Ambulatory Visit (HOSPITAL_COMMUNITY): Payer: Medicare HMO | Admitting: Certified Registered Nurse Anesthetist

## 2020-01-05 ENCOUNTER — Encounter (HOSPITAL_COMMUNITY): Payer: Self-pay | Admitting: Podiatry

## 2020-01-05 ENCOUNTER — Other Ambulatory Visit (HOSPITAL_COMMUNITY): Payer: Medicare HMO

## 2020-01-05 ENCOUNTER — Inpatient Hospital Stay (HOSPITAL_COMMUNITY)
Admission: AD | Admit: 2020-01-05 | Discharge: 2020-01-08 | DRG: 617 | Disposition: A | Discharge status: Home / Self Care | Payer: Medicare HMO | Attending: Internal Medicine | Admitting: Internal Medicine

## 2020-01-05 ENCOUNTER — Ambulatory Visit (HOSPITAL_COMMUNITY): Payer: Medicare HMO

## 2020-01-05 ENCOUNTER — Encounter: Payer: Medicare HMO | Admitting: Podiatry

## 2020-01-05 DIAGNOSIS — D509 Iron deficiency anemia, unspecified: Secondary | ICD-10-CM | POA: Diagnosis present

## 2020-01-05 DIAGNOSIS — E860 Dehydration: Secondary | ICD-10-CM | POA: Diagnosis present

## 2020-01-05 DIAGNOSIS — N184 Chronic kidney disease, stage 4 (severe): Secondary | ICD-10-CM | POA: Diagnosis present

## 2020-01-05 DIAGNOSIS — Z9109 Other allergy status, other than to drugs and biological substances: Secondary | ICD-10-CM

## 2020-01-05 DIAGNOSIS — E11621 Type 2 diabetes mellitus with foot ulcer: Secondary | ICD-10-CM | POA: Diagnosis present

## 2020-01-05 DIAGNOSIS — Z79899 Other long term (current) drug therapy: Secondary | ICD-10-CM

## 2020-01-05 DIAGNOSIS — I129 Hypertensive chronic kidney disease with stage 1 through stage 4 chronic kidney disease, or unspecified chronic kidney disease: Secondary | ICD-10-CM | POA: Diagnosis present

## 2020-01-05 DIAGNOSIS — E1122 Type 2 diabetes mellitus with diabetic chronic kidney disease: Secondary | ICD-10-CM | POA: Diagnosis present

## 2020-01-05 DIAGNOSIS — E1140 Type 2 diabetes mellitus with diabetic neuropathy, unspecified: Secondary | ICD-10-CM | POA: Diagnosis present

## 2020-01-05 DIAGNOSIS — L97529 Non-pressure chronic ulcer of other part of left foot with unspecified severity: Secondary | ICD-10-CM | POA: Diagnosis present

## 2020-01-05 DIAGNOSIS — Z91013 Allergy to seafood: Secondary | ICD-10-CM | POA: Diagnosis not present

## 2020-01-05 DIAGNOSIS — E1165 Type 2 diabetes mellitus with hyperglycemia: Secondary | ICD-10-CM | POA: Diagnosis present

## 2020-01-05 DIAGNOSIS — Z91040 Latex allergy status: Secondary | ICD-10-CM

## 2020-01-05 DIAGNOSIS — I10 Essential (primary) hypertension: Secondary | ICD-10-CM | POA: Diagnosis present

## 2020-01-05 DIAGNOSIS — Z880 Allergy status to penicillin: Secondary | ICD-10-CM

## 2020-01-05 DIAGNOSIS — E10621 Type 1 diabetes mellitus with foot ulcer: Secondary | ICD-10-CM | POA: Diagnosis not present

## 2020-01-05 DIAGNOSIS — Z6830 Body mass index (BMI) 30.0-30.9, adult: Secondary | ICD-10-CM

## 2020-01-05 DIAGNOSIS — Z883 Allergy status to other anti-infective agents status: Secondary | ICD-10-CM

## 2020-01-05 DIAGNOSIS — D631 Anemia in chronic kidney disease: Secondary | ICD-10-CM | POA: Diagnosis present

## 2020-01-05 DIAGNOSIS — K219 Gastro-esophageal reflux disease without esophagitis: Secondary | ICD-10-CM | POA: Diagnosis present

## 2020-01-05 DIAGNOSIS — Z09 Encounter for follow-up examination after completed treatment for conditions other than malignant neoplasm: Secondary | ICD-10-CM

## 2020-01-05 DIAGNOSIS — E1169 Type 2 diabetes mellitus with other specified complication: Principal | ICD-10-CM | POA: Diagnosis present

## 2020-01-05 DIAGNOSIS — Z87891 Personal history of nicotine dependence: Secondary | ICD-10-CM

## 2020-01-05 DIAGNOSIS — Z7982 Long term (current) use of aspirin: Secondary | ICD-10-CM

## 2020-01-05 DIAGNOSIS — E669 Obesity, unspecified: Secondary | ICD-10-CM | POA: Diagnosis present

## 2020-01-05 DIAGNOSIS — E785 Hyperlipidemia, unspecified: Secondary | ICD-10-CM | POA: Diagnosis present

## 2020-01-05 DIAGNOSIS — E1142 Type 2 diabetes mellitus with diabetic polyneuropathy: Secondary | ICD-10-CM | POA: Diagnosis not present

## 2020-01-05 DIAGNOSIS — L97429 Non-pressure chronic ulcer of left heel and midfoot with unspecified severity: Secondary | ICD-10-CM | POA: Diagnosis not present

## 2020-01-05 DIAGNOSIS — M86672 Other chronic osteomyelitis, left ankle and foot: Secondary | ICD-10-CM | POA: Diagnosis not present

## 2020-01-05 DIAGNOSIS — E119 Type 2 diabetes mellitus without complications: Secondary | ICD-10-CM

## 2020-01-05 DIAGNOSIS — E871 Hypo-osmolality and hyponatremia: Secondary | ICD-10-CM | POA: Diagnosis present

## 2020-01-05 DIAGNOSIS — Z794 Long term (current) use of insulin: Secondary | ICD-10-CM

## 2020-01-05 DIAGNOSIS — M869 Osteomyelitis, unspecified: Secondary | ICD-10-CM | POA: Diagnosis present

## 2020-01-05 DIAGNOSIS — L97509 Non-pressure chronic ulcer of other part of unspecified foot with unspecified severity: Secondary | ICD-10-CM | POA: Diagnosis present

## 2020-01-05 DIAGNOSIS — Z20822 Contact with and (suspected) exposure to covid-19: Secondary | ICD-10-CM | POA: Diagnosis present

## 2020-01-05 HISTORY — DX: Gastro-esophageal reflux disease without esophagitis: K21.9

## 2020-01-05 HISTORY — PX: TRANSMETATARSAL AMPUTATION: SHX6197

## 2020-01-05 HISTORY — PX: WOUND DEBRIDEMENT: SHX247

## 2020-01-05 LAB — GLUCOSE, CAPILLARY
Glucose-Capillary: 190 mg/dL — ABNORMAL HIGH (ref 70–99)
Glucose-Capillary: 122 mg/dL — ABNORMAL HIGH (ref 70–99)
Glucose-Capillary: 224 mg/dL — ABNORMAL HIGH (ref 70–99)
Glucose-Capillary: 453 mg/dL — ABNORMAL HIGH (ref 70–99)

## 2020-01-05 SURGERY — AMPUTATION, FOOT, TRANSMETATARSAL
Anesthesia: General | Site: Toe | Laterality: Left

## 2020-01-05 MED ORDER — INSULIN ASPART 100 UNIT/ML ~~LOC~~ SOLN
0.0000 [IU] | Freq: Every day | SUBCUTANEOUS | Status: DC
  Administered 2020-01-05: 5 [IU] via SUBCUTANEOUS
  Administered 2020-01-07: 3 [IU] via SUBCUTANEOUS

## 2020-01-05 MED ORDER — HEPARIN SODIUM (PORCINE) 5000 UNIT/ML IJ SOLN
5000.0000 [IU] | Freq: Three times a day (TID) | INTRAMUSCULAR | Status: DC
  Administered 2020-01-05 – 2020-01-08 (×6): 5000 [IU] via SUBCUTANEOUS
  Filled 2020-01-05 (×6): qty 1

## 2020-01-05 MED ORDER — LACTATED RINGERS IV SOLN: INTRAVENOUS | Status: DC

## 2020-01-05 MED ORDER — 0.9 % SODIUM CHLORIDE (POUR BTL) OPTIME
TOPICAL | Status: DC | PRN
  Administered 2020-01-05: 1000 mL

## 2020-01-05 MED ORDER — EPHEDRINE SULFATE-NACL 50-0.9 MG/10ML-% IV SOSY
PREFILLED_SYRINGE | INTRAVENOUS | Status: DC | PRN
  Administered 2020-01-05: 10 mg via INTRAVENOUS

## 2020-01-05 MED ORDER — HYDROCODONE-ACETAMINOPHEN 5-325 MG PO TABS
1.0000 | ORAL_TABLET | Freq: Four times a day (QID) | ORAL | Status: DC | PRN
  Administered 2020-01-05 – 2020-01-08 (×5): 1 via ORAL
  Filled 2020-01-05 (×5): qty 1

## 2020-01-05 MED ORDER — LIDOCAINE HCL (PF) 1 % IJ SOLN
INTRAMUSCULAR | Status: AC
  Filled 2020-01-05: qty 30

## 2020-01-05 MED ORDER — SODIUM CHLORIDE 0.9 % IV SOLN: INTRAVENOUS | Status: DC

## 2020-01-05 MED ORDER — ATORVASTATIN CALCIUM 10 MG PO TABS
10.0000 mg | ORAL_TABLET | Freq: Every day | ORAL | Status: DC
  Administered 2020-01-05 – 2020-01-08 (×4): 10 mg via ORAL
  Filled 2020-01-05 (×4): qty 1

## 2020-01-05 MED ORDER — PANTOPRAZOLE SODIUM 40 MG PO TBEC
40.0000 mg | DELAYED_RELEASE_TABLET | Freq: Every day | ORAL | Status: DC
  Administered 2020-01-05 – 2020-01-08 (×4): 40 mg via ORAL
  Filled 2020-01-05 (×4): qty 1

## 2020-01-05 MED ORDER — VARENICLINE TARTRATE 1 MG PO TABS
1.0000 mg | ORAL_TABLET | Freq: Two times a day (BID) | ORAL | Status: DC
  Administered 2020-01-05 – 2020-01-08 (×5): 1 mg via ORAL
  Filled 2020-01-05 (×8): qty 1

## 2020-01-05 MED ORDER — PROPOFOL 10 MG/ML IV BOLUS
INTRAVENOUS | Status: AC
  Filled 2020-01-05: qty 20

## 2020-01-05 MED ORDER — PHENYLEPHRINE HCL-NACL 10-0.9 MG/250ML-% IV SOLN
INTRAVENOUS | Status: DC | PRN
  Administered 2020-01-05: 15 ug/min via INTRAVENOUS

## 2020-01-05 MED ORDER — MIDAZOLAM HCL 2 MG/2ML IJ SOLN
INTRAMUSCULAR | Status: AC
  Filled 2020-01-05: qty 2

## 2020-01-05 MED ORDER — INSULIN ASPART 100 UNIT/ML ~~LOC~~ SOLN
0.0000 [IU] | Freq: Three times a day (TID) | SUBCUTANEOUS | Status: DC
  Administered 2020-01-05 – 2020-01-06 (×2): 5 [IU] via SUBCUTANEOUS
  Administered 2020-01-06: 8 [IU] via SUBCUTANEOUS
  Administered 2020-01-06: 11 [IU] via SUBCUTANEOUS
  Administered 2020-01-07 – 2020-01-08 (×2): 3 [IU] via SUBCUTANEOUS

## 2020-01-05 MED ORDER — INSULIN ASPART 100 UNIT/ML ~~LOC~~ SOLN
SUBCUTANEOUS | Status: AC
  Filled 2020-01-05: qty 1

## 2020-01-05 MED ORDER — PHENYLEPHRINE 40 MCG/ML (10ML) SYRINGE FOR IV PUSH (FOR BLOOD PRESSURE SUPPORT)
PREFILLED_SYRINGE | INTRAVENOUS | Status: DC | PRN
  Administered 2020-01-05 (×3): 80 ug via INTRAVENOUS
  Administered 2020-01-05: 120 ug via INTRAVENOUS

## 2020-01-05 MED ORDER — BUPIVACAINE HCL (PF) 0.5 % IJ SOLN
INTRAMUSCULAR | Status: AC
  Filled 2020-01-05: qty 30

## 2020-01-05 MED ORDER — MORPHINE SULFATE (PF) 4 MG/ML IV SOLN
2.0000 mg | INTRAVENOUS | Status: DC | PRN
  Administered 2020-01-07: 2 mg via INTRAVENOUS
  Filled 2020-01-05: qty 1

## 2020-01-05 MED ORDER — LIDOCAINE 2% (20 MG/ML) 5 ML SYRINGE
INTRAMUSCULAR | Status: DC | PRN
  Administered 2020-01-05: 100 mg via INTRAVENOUS

## 2020-01-05 MED ORDER — CARVEDILOL 6.25 MG PO TABS
6.2500 mg | ORAL_TABLET | Freq: Two times a day (BID) | ORAL | Status: DC
  Administered 2020-01-05 – 2020-01-08 (×6): 6.25 mg via ORAL
  Filled 2020-01-05 (×6): qty 1

## 2020-01-05 MED ORDER — INSULIN GLARGINE 100 UNIT/ML ~~LOC~~ SOLN
20.0000 [IU] | Freq: Every day | SUBCUTANEOUS | Status: DC
  Administered 2020-01-05 – 2020-01-07 (×3): 20 [IU] via SUBCUTANEOUS
  Filled 2020-01-05 (×3): qty 0.2

## 2020-01-05 MED ORDER — DEXAMETHASONE SODIUM PHOSPHATE 10 MG/ML IJ SOLN
INTRAMUSCULAR | Status: DC | PRN
  Administered 2020-01-05: 5 mg via INTRAVENOUS

## 2020-01-05 MED ORDER — ASPIRIN EC 81 MG PO TBEC
81.0000 mg | DELAYED_RELEASE_TABLET | Freq: Every day | ORAL | Status: DC
  Administered 2020-01-05 – 2020-01-08 (×4): 81 mg via ORAL
  Filled 2020-01-05 (×4): qty 1

## 2020-01-05 MED ORDER — ONDANSETRON HCL 4 MG/2ML IJ SOLN: 4.0000 mg | Freq: Once | INTRAMUSCULAR | Status: DC | PRN

## 2020-01-05 MED ORDER — BUPIVACAINE HCL (PF) 0.5 % IJ SOLN
INTRAMUSCULAR | Status: DC | PRN
  Administered 2020-01-05: 10 mL

## 2020-01-05 MED ORDER — FENTANYL CITRATE (PF) 100 MCG/2ML IJ SOLN: 25.0000 ug | INTRAMUSCULAR | Status: DC | PRN

## 2020-01-05 MED ORDER — SODIUM CHLORIDE 0.9 % IV SOLN: 2.0000 g | Freq: Two times a day (BID) | INTRAVENOUS | Status: DC

## 2020-01-05 MED ORDER — AMLODIPINE BESYLATE 10 MG PO TABS
10.0000 mg | ORAL_TABLET | Freq: Every day | ORAL | Status: DC
  Administered 2020-01-05 – 2020-01-08 (×4): 10 mg via ORAL
  Filled 2020-01-05 (×4): qty 1

## 2020-01-05 MED ORDER — ONDANSETRON HCL 4 MG/2ML IJ SOLN
INTRAMUSCULAR | Status: DC | PRN
  Administered 2020-01-05: 4 mg via INTRAVENOUS

## 2020-01-05 MED ORDER — VANCOMYCIN HCL 1750 MG/350ML IV SOLN
1750.0000 mg | INTRAVENOUS | Status: DC
  Administered 2020-01-07: 1750 mg via INTRAVENOUS
  Filled 2020-01-05: qty 350

## 2020-01-05 MED ORDER — LIDOCAINE HCL (PF) 1 % IJ SOLN
INTRAMUSCULAR | Status: DC | PRN
  Administered 2020-01-05: 10 mL

## 2020-01-05 MED ORDER — ACETAMINOPHEN 10 MG/ML IV SOLN: 1000.0000 mg | Freq: Once | INTRAVENOUS | Status: DC | PRN

## 2020-01-05 MED ORDER — PROPOFOL 10 MG/ML IV BOLUS
INTRAVENOUS | Status: DC | PRN
  Administered 2020-01-05: 200 mg via INTRAVENOUS
  Administered 2020-01-05: 70 mg via INTRAVENOUS

## 2020-01-05 MED ORDER — SODIUM CHLORIDE 0.9 % IV SOLN
2.0000 g | Freq: Two times a day (BID) | INTRAVENOUS | Status: DC
  Administered 2020-01-05 – 2020-01-07 (×5): 2 g via INTRAVENOUS
  Filled 2020-01-05 (×7): qty 2

## 2020-01-05 MED ORDER — MIDAZOLAM HCL 5 MG/5ML IJ SOLN
INTRAMUSCULAR | Status: DC | PRN
  Administered 2020-01-05: 2 mg via INTRAVENOUS

## 2020-01-05 MED ORDER — FENTANYL CITRATE (PF) 100 MCG/2ML IJ SOLN
INTRAMUSCULAR | Status: DC | PRN
  Administered 2020-01-05 (×2): 50 ug via INTRAVENOUS

## 2020-01-05 MED ORDER — FENTANYL CITRATE (PF) 100 MCG/2ML IJ SOLN
INTRAMUSCULAR | Status: AC
  Filled 2020-01-05: qty 2

## 2020-01-05 SURGICAL SUPPLY — 60 items
BLADE AVERAGE 25X9 (BLADE) IMPLANT
BLADE OSCILLATING/SAGITTAL (BLADE)
BLADE SURG 15 STRL LF DISP TIS (BLADE) ×4 IMPLANT
BLADE SURG 15 STRL SS (BLADE) ×6
BLADE SURG SZ10 CARB STEEL (BLADE) ×1 IMPLANT
BLADE SW THK.38XMED LNG THN (BLADE) ×2 IMPLANT
BNDG CMPR 9X4 STRL LF SNTH (GAUZE/BANDAGES/DRESSINGS) ×2
BNDG ELASTIC 4X5.8 VLCR STR LF (GAUZE/BANDAGES/DRESSINGS) ×1 IMPLANT
BNDG ELASTIC 6X5.8 VLCR STR LF (GAUZE/BANDAGES/DRESSINGS) ×2 IMPLANT
BNDG ESMARK 4X9 LF (GAUZE/BANDAGES/DRESSINGS) ×3 IMPLANT
BNDG GAUZE ELAST 4 BULKY (GAUZE/BANDAGES/DRESSINGS) ×3 IMPLANT
CNTNR URN SCR LID CUP LEK RST (MISCELLANEOUS) IMPLANT
CONT SPEC 4OZ STRL OR WHT (MISCELLANEOUS) ×6
COVER BACK TABLE 60X90IN (DRAPES) ×3 IMPLANT
COVER WAND RF STERILE (DRAPES) IMPLANT
CUFF TOURN SGL QUICK 18X4 (TOURNIQUET CUFF) ×1 IMPLANT
DRAPE EXTREMITY T 121X128X90 (DISPOSABLE) ×3 IMPLANT
DRAPE IMP U-DRAPE 54X76 (DRAPES) ×2 IMPLANT
DRAPE OEC MINIVIEW 54X84 (DRAPES) ×1 IMPLANT
DRSG EMULSION OIL 3X3 NADH (GAUZE/BANDAGES/DRESSINGS) ×2 IMPLANT
DRSG PAD ABDOMINAL 8X10 ST (GAUZE/BANDAGES/DRESSINGS) ×1 IMPLANT
DURAPREP 26ML APPLICATOR (WOUND CARE) ×2 IMPLANT
ELECT REM PT RETURN 15FT ADLT (MISCELLANEOUS) ×3 IMPLANT
GAUZE 4X4 16PLY RFD (DISPOSABLE) IMPLANT
GAUZE PACKING 1 X5 YD ST (GAUZE/BANDAGES/DRESSINGS) ×1 IMPLANT
GAUZE SPONGE 4X4 12PLY STRL (GAUZE/BANDAGES/DRESSINGS) ×3 IMPLANT
GLOVE BIO SURGEON STRL SZ7.5 (GLOVE) ×6 IMPLANT
GOWN STRL REUS W/ TWL LRG LVL3 (GOWN DISPOSABLE) ×2 IMPLANT
GOWN STRL REUS W/ TWL XL LVL3 (GOWN DISPOSABLE) ×2 IMPLANT
GOWN STRL REUS W/TWL LRG LVL3 (GOWN DISPOSABLE) ×3
GOWN STRL REUS W/TWL XL LVL3 (GOWN DISPOSABLE) ×3
KIT BASIN (CUSTOM PROCEDURE TRAY) ×3 IMPLANT
NDL BIOPSY JAMSHIDI 11X6 (NEEDLE) IMPLANT
NDL HYPO 25X1 1.5 SAFETY (NEEDLE) ×2 IMPLANT
NDL SAFETY ECLIPSE 18X1.5 (NEEDLE) IMPLANT
NEEDLE BIOPSY JAMSHIDI 11X6 (NEEDLE) ×3 IMPLANT
NEEDLE HYPO 18GX1.5 SHARP (NEEDLE)
NEEDLE HYPO 25X1 1.5 SAFETY (NEEDLE) ×3 IMPLANT
NS IRRIG 1000ML POUR BTL (IV SOLUTION) IMPLANT
PADDING CAST ABS 4INX4YD NS (CAST SUPPLIES)
PADDING CAST ABS COTTON 4X4 ST (CAST SUPPLIES) ×2 IMPLANT
PENCIL SMOKE EVACUATOR (MISCELLANEOUS) ×3 IMPLANT
SPONGE LAP 18X18 RF (DISPOSABLE) ×2 IMPLANT
STAPLER VISISTAT (STAPLE) ×2 IMPLANT
STOCKINETTE 6  STRL (DRAPES) ×3
STOCKINETTE 6 STRL (DRAPES) ×2 IMPLANT
SUT MNCRL AB 3-0 PS2 18 (SUTURE) ×2 IMPLANT
SUT PROLENE 2 0 CT2 30 (SUTURE) ×2 IMPLANT
SUT PROLENE 3 0 PS 2 (SUTURE) ×6 IMPLANT
SUT SILK 2 0 (SUTURE) ×3
SUT SILK 2-0 18XBRD TIE 12 (SUTURE) ×2 IMPLANT
SUT VIC AB 3-0 SH 27 (SUTURE) ×3
SUT VIC AB 3-0 SH 27XBRD (SUTURE) IMPLANT
SYR 10ML LL (SYRINGE) IMPLANT
SYR BULB EAR ULCER 3OZ GRN STR (SYRINGE) ×2 IMPLANT
SYR BULB IRRIG 60ML STRL (SYRINGE) ×1 IMPLANT
SYR CONTROL 10ML LL (SYRINGE) ×1 IMPLANT
TOWEL OR 17X26 10 PK STRL BLUE (TOWEL DISPOSABLE) ×1 IMPLANT
TRAY PREP A LATEX SAFE STRL (SET/KITS/TRAYS/PACK) ×3 IMPLANT
UNDERPAD 30X36 HEAVY ABSORB (UNDERPADS AND DIAPERS) ×3 IMPLANT

## 2020-01-05 NOTE — Anesthesia Procedure Notes (Addendum)
Procedure Name: LMA Insertion Date/Time: 01/05/2020 12:46 PM Performed by: West Pugh, CRNA Pre-anesthesia Checklist: Patient identified, Emergency Drugs available, Suction available and Patient being monitored Patient Re-evaluated:Patient Re-evaluated prior to induction Oxygen Delivery Method: Circle system utilized and Simple face mask Preoxygenation: Pre-oxygenation with 100% oxygen Induction Type: IV induction LMA: LMA with gastric port inserted LMA Size: 5.0 Number of attempts: 1 Placement Confirmation: positive ETCO2 and breath sounds checked- equal and bilateral Tube secured with: Tape Dental Injury: Teeth and Oropharynx as per pre-operative assessment

## 2020-01-05 NOTE — Progress Notes (Signed)
Pharmacy Antibiotic Note  Anthony Flowers is a 59 y.o. male admitted on 01/05/2020 with wound infection of foot  Pharmacy has been consulted for vancomycin dosing.  Plan: Vancomycin 1750mg  IV q48h (pt received 1500mg  V x 1 pre-op) AUC 477.4, Scr 2.3 Cefepime 2gm IV q12h Follow renal function Follow cultures and clinical course  Weight: 102.1 kg (225 lb 1.8 oz)  Temp (24hrs), Avg:97.9 F (36.6 C), Min:97.8 F (36.6 C), Max:98 F (36.7 C)  Recent Labs  Lab 12/31/19 0853 01/02/20 1538  WBC 8.5 10.1  CREATININE 2.36* 2.33*    Estimated Creatinine Clearance: 42.2 mL/min (A) (by C-G formula based on SCr of 2.33 mg/dL (H)).    Allergies  Allergen Reactions  . Latex Hives  . Tape Hives  . Chlorhexidine Itching and Rash    Develops skin irritation   . Fish Allergy Hives and Rash    Patient unsure which fish  . Penicillins Itching, Swelling and Rash    Did it involve swelling of the face/tongue/throat, SOB, or low BP? Yes Did it involve sudden or severe rash/hives, skin peeling, or any reaction on the inside of your mouth or nose? No Did you need to seek medical attention at a hospital or doctor's office? Yes When did it last happen?[age] years old If all above answers are "NO", may proceed with cephalosporin use.     Antimicrobials this admission: 5/17 vanc >> 5/17 cefepime >> Dose adjustments this admission:   Microbiology results: 5/17 surgical/ deep wound :  Thank you for allowing pharmacy to be a part of this patient's care.  Dolly Rias RPh 01/05/2020, 3:32 PM

## 2020-01-05 NOTE — Transfer of Care (Signed)
Immediate Anesthesia Transfer of Care Note  Patient: Anthony Flowers  Procedure(s) Performed: REVISION TRANSMETATARSAL AMPUTATION (Left Toe) DEBRIDEMENT WOUND (Left )  Patient Location: PACU  Anesthesia Type:General  Level of Consciousness: awake, alert , oriented and patient cooperative  Airway & Oxygen Therapy: Patient Spontanous Breathing and Patient connected to face mask oxygen  Post-op Assessment: Report given to RN and Post -op Vital signs reviewed and stable  Post vital signs: Reviewed and stable  Last Vitals:  Vitals Value Taken Time  BP 97/52 01/05/20 1412  Temp    Pulse 68 01/05/20 1414  Resp 22 01/05/20 1414  SpO2 100 % 01/05/20 1414  Vitals shown include unvalidated device data.  Last Pain:  Vitals:   01/05/20 1055  TempSrc:   PainSc: 6          Complications: No apparent anesthesia complications

## 2020-01-05 NOTE — Progress Notes (Signed)
Received a call from PACU and patient is wanting to leave AMA as he does not want to stay in PACU. He is still awaiting a bed. I spoke to the patient that as soon as a bed opens he will be given a room. He said he is hungry and has not had anything to eat. The PACU nurse said they have ordered a tray for him. Advised him not to leave AMA.

## 2020-01-05 NOTE — Anesthesia Postprocedure Evaluation (Signed)
Anesthesia Post Note  Patient: Anthony Flowers  Procedure(s) Performed: REVISION TRANSMETATARSAL AMPUTATION (Left Toe) DEBRIDEMENT WOUND (Left )     Patient location during evaluation: PACU Anesthesia Type: General Level of consciousness: awake and alert Pain management: pain level controlled Vital Signs Assessment: post-procedure vital signs reviewed and stable Respiratory status: spontaneous breathing, nonlabored ventilation, respiratory function stable and patient connected to nasal cannula oxygen Cardiovascular status: blood pressure returned to baseline and stable Postop Assessment: no apparent nausea or vomiting Anesthetic complications: no    Last Vitals:  Vitals:   01/05/20 1040  BP: 129/90  Pulse: 70  Resp: 17  Temp: 36.7 C  SpO2: 100%    Last Pain:  Vitals:   01/05/20 1055  TempSrc:   PainSc: 6                  Castin Donaghue S

## 2020-01-05 NOTE — Anesthesia Preprocedure Evaluation (Signed)
Anesthesia Evaluation  Patient identified by MRN, date of birth, ID band Patient awake    Reviewed: Allergy & Precautions, NPO status , Patient's Chart, lab work & pertinent test results  Airway Mallampati: II  TM Distance: >3 FB Neck ROM: Full    Dental no notable dental hx.    Pulmonary neg pulmonary ROS, former smoker,    Pulmonary exam normal breath sounds clear to auscultation       Cardiovascular hypertension, + Peripheral Vascular Disease  Normal cardiovascular exam Rhythm:Regular Rate:Normal     Neuro/Psych negative neurological ROS  negative psych ROS   GI/Hepatic Neg liver ROS, GERD  ,  Endo/Other  diabetes, Insulin Dependent  Renal/GU Renal InsufficiencyRenal disease  negative genitourinary   Musculoskeletal negative musculoskeletal ROS (+)   Abdominal   Peds negative pediatric ROS (+)  Hematology  (+) anemia ,   Anesthesia Other Findings   Reproductive/Obstetrics negative OB ROS                             Anesthesia Physical Anesthesia Plan  ASA: III  Anesthesia Plan: General   Post-op Pain Management:    Induction: Intravenous  PONV Risk Score and Plan: 2 and Ondansetron, Midazolam, Treatment may vary due to age or medical condition and Dexamethasone  Airway Management Planned: LMA  Additional Equipment:   Intra-op Plan:   Post-operative Plan: Extubation in OR  Informed Consent: I have reviewed the patients History and Physical, chart, labs and discussed the procedure including the risks, benefits and alternatives for the proposed anesthesia with the patient or authorized representative who has indicated his/her understanding and acceptance.     Dental advisory given  Plan Discussed with: CRNA and Surgeon  Anesthesia Plan Comments:         Anesthesia Quick Evaluation

## 2020-01-05 NOTE — Progress Notes (Signed)
M.Denny called back. Ordered to recheck pt's cbg after 2hrs . No extra insuline ordered at this time
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
## 2020-01-05 NOTE — Progress Notes (Signed)
Anesthesia H&P Update: History and Physical Exam reviewed; patient is OK for planned anesthetic and procedure. ? ?

## 2020-01-05 NOTE — Brief Op Note (Signed)
01/05/2020  2:04 PM  PATIENT:  Anthony Flowers  59 y.o. male  PRE-OPERATIVE DIAGNOSIS:  ULCER LEFT FOOT  POST-OPERATIVE DIAGNOSIS:  ULCER LEFT FOOT  PROCEDURE:  Procedure(s): REVISION TRANSMETATARSAL AMPUTATION (Left) DEBRIDEMENT WOUND (Left)  SURGEON:  Surgeon(s) and Role:    Trula Slade, DPM - Primary  PHYSICIAN ASSISTANT:   ASSISTANTS: none   ANESTHESIA:   general  EBL:  150 mL   BLOOD ADMINISTERED:none  DRAINS: none   LOCAL MEDICATIONS USED:  OTHER 20 cc lidocaine and marcaine plain  SPECIMEN:  Source of Specimen:  wound culture and bone biopsy of the cuboid/lateral cuneiform   DISPOSITION OF SPECIMEN:  PATHOLOGY  COUNTS:  YES  TOURNIQUET:  * Missing tourniquet times found for documented tourniquets in log: [id number] *  DICTATION: .Viviann Spare Dictation  PLAN OF CARE: Admit to inpatient   PATIENT DISPOSITION:  PACU - hemodynamically stable.   Delay start of Pharmacological VTE agent (>24hrs) due to surgical blood loss or risk of bleeding: no  Intraoperative findings: No abscess noted. Removed metatarsals and debrided the wound. No proxima tracking. Wound loosely closed with central area packed open. Will plan to return to surgery as we discussed for likely debridement/closure of the wound on Wednesday afternoon. Admit and start antibiotics to await cultures.   Celesta Gentile, DPM O: [phone] C: [phone]

## 2020-01-05 NOTE — Progress Notes (Signed)
Patient seen in pre-op holding. We had reviewed the MRI over the weekend with him but went through it again. We will plan on likely resection of remaninig metatarsals and bone biopsy of the questionable bones. The consent formed updated and signed. NPO. Will admit postop. Discussed depending on the infection may need repeat debridement with delayed primary closure. He understands. We also discussed risks of surgery including, but not limited to, further amputation/loss of leg, spread of infection.

## 2020-01-05 NOTE — Progress Notes (Signed)
Subjective: Anthony Flowers is a 59 y.o. is seen today in office s/p left foot TMA preformed on 11/19/2019.  The central portion of the wound has opened and we may try to get a wound VAC for him.  He presents today for further evaluation.  He has some drainage but is mostly clear to bloody denies any pus.  He has been having some pain of the foot.  Mild swelling of the foot but no increase in any redness or any red streaks.  Overall he feels well and denies any fevers, chills, nausea, vomiting.  There is no calf pain, chest pain, shortness of breath.   Objective: General: No acute distress, AAOx3  DP/PT pulses palpable 2/4, CRT < 3 sec to all digits.  LEFT foot: Incision is well coapted without any evidence of dehiscence with sutures, staples intact however the central aspect has dehisced and is full-thickness.  There is also a smaller lesion just lateral to this that is starting.  There is no edema to the foot there is no erythema or warmth.  There is no ascending cellulitis.  There is no areas of fluctuance or crepitation.  There is no malodor. No other areas of tenderness to bilateral lower extremities.  No other open lesions or pre-ulcerative lesions.  No pain with calf compression, swelling, warmth, erythema.   Assessment and Plan:  Status post left foot transmetatarsal imitation with central ulceration  -Treatment options discussed including all alternatives, risks, and complications -Discussed with him trying to get a wound VAC however I initially try to get a snap VAC but due to cost he did not want proceed with this.  We have been working on trying to get a regular KCI wound VAC for him as well.  Discussed with him likely surgical debridement with graft application and VAC.  We will continue antibiotics, Keflex.  Monitor closely for any signs or symptoms infection of the ER should any occur.  Continue daily dressing changes.  Recommend nonweightbearing.  Return in about 1 week (around  12/25/2019).  Trula Slade DPM

## 2020-01-05 NOTE — Progress Notes (Signed)
Pt's Blood glucose 453,administered 5 units aspart insuline and 20 units Lantus . Paged oncall M.Sharlet Salina x2 for further evaluation.Waiting for MD to response. Pt was telling for that CBG he takes 20 units aspart insuline at home. Will continue to monitor.

## 2020-01-06 ENCOUNTER — Ambulatory Visit: Payer: Medicare HMO | Admitting: Podiatry

## 2020-01-06 ENCOUNTER — Telehealth: Payer: Self-pay | Admitting: *Deleted

## 2020-01-06 DIAGNOSIS — E1142 Type 2 diabetes mellitus with diabetic polyneuropathy: Secondary | ICD-10-CM

## 2020-01-06 DIAGNOSIS — M86672 Other chronic osteomyelitis, left ankle and foot: Secondary | ICD-10-CM

## 2020-01-06 DIAGNOSIS — E785 Hyperlipidemia, unspecified: Secondary | ICD-10-CM

## 2020-01-06 DIAGNOSIS — I10 Essential (primary) hypertension: Secondary | ICD-10-CM

## 2020-01-06 DIAGNOSIS — Z794 Long term (current) use of insulin: Secondary | ICD-10-CM

## 2020-01-06 LAB — BASIC METABOLIC PANEL WITH GFR
Anion gap: 12 (ref 5–15)
BUN: 40 mg/dL — ABNORMAL HIGH (ref 6–20)
CO2: 19 mmol/L — ABNORMAL LOW (ref 22–32)
Calcium: 9.3 mg/dL (ref 8.9–10.3)
Chloride: 99 mmol/L (ref 98–111)
Creatinine, Ser: 2.26 mg/dL — ABNORMAL HIGH (ref 0.61–1.24)
GFR calc Af Amer: 35 mL/min — ABNORMAL LOW (ref >60)
GFR calc non Af Amer: 31 mL/min — ABNORMAL LOW (ref >60)
Glucose, Bld: 204 mg/dL — ABNORMAL HIGH (ref 70–99)
Potassium: 5.2 mmol/L — ABNORMAL HIGH (ref 3.5–5.1)
Sodium: 130 mmol/L — ABNORMAL LOW (ref 135–145)

## 2020-01-06 LAB — CBC
HCT: 29.9 % — ABNORMAL LOW (ref 39.0–52.0)
Hemoglobin: 8.9 g/dL — ABNORMAL LOW (ref 13.0–17.0)
MCH: 22.3 pg — ABNORMAL LOW (ref 26.0–34.0)
MCHC: 29.8 g/dL — ABNORMAL LOW (ref 30.0–36.0)
MCV: 74.9 fL — ABNORMAL LOW (ref 80.0–100.0)
Platelets: 632 K/uL — ABNORMAL HIGH (ref 150–400)
RBC: 3.99 MIL/uL — ABNORMAL LOW (ref 4.22–5.81)
RDW: 16.6 % — ABNORMAL HIGH (ref 11.5–15.5)
WBC: 15.6 K/uL — ABNORMAL HIGH (ref 4.0–10.5)
nRBC: 0 % (ref 0.0–0.2)

## 2020-01-06 LAB — GLUCOSE, CAPILLARY
Glucose-Capillary: 196 mg/dL — ABNORMAL HIGH (ref 70–99)
Glucose-Capillary: 223 mg/dL — ABNORMAL HIGH (ref 70–99)
Glucose-Capillary: 271 mg/dL — ABNORMAL HIGH (ref 70–99)
Glucose-Capillary: 290 mg/dL — ABNORMAL HIGH (ref 70–99)
Glucose-Capillary: 315 mg/dL — ABNORMAL HIGH (ref 70–99)

## 2020-01-06 NOTE — Progress Notes (Signed)
PROGRESS NOTE  Anthony Flowers KNL:[id number] DOB: [birth year] DOA: 01/05/2020 PCP: Julian Hy, PA-C   LOS: 1 day   Brief Narrative / Interim history: 59 year old male with DM 2, poorly controlled, chronic kidney disease stage 3b, hypertension, diabetic foot ulcers, who underwent elective revision transmetatarsal amputation and debridement of the left foot ulcer by Dr. Earleen Newport on 5/17, we were asked to admit for IV antibiotics and patient is to undergo repeat debridement and surgical closure of the wound on 5/19.  An MRI done 3 days ago on 5/15 showed acute osteomyelitis involving the bases of all residual metatarsal as well as a complex fluid collection at the level of the fifth metatarsal base.  Cultures were sent intraoperatively  Subjective / 24h Interval events: He is doing well this morning, complaining about a diabetic diet, wanting to change to regular.  No chest pain, no shortness of breath.  No abdominal pain, no nausea or vomiting.  Assessment & Plan: Principal Problem Left foot osteomyelitis/diabetic foot ulcer-he underwent left foot TMA on 3/31 2021 however MRI done on 5/15 showed acute osteomyelitis involving the bases of all residual metatarsals as well as a complex fluid collection.  Underwent surgery on 5/17.  Patient was started on vancomycin and cefepime, continue broad-spectrum for now and monitor cultures, will undergo repeat surgical intervention tomorrow 5/19  Active Problems Essential hypertension-continue Norvasc, carvedilol, blood pressure stable today  Type 2 diabetes mellitus-poorly controlled, hemoglobin A1c 8.6, with hyperglycemia.  Continue Lantus as well as sliding scale.  CBG (last 3)  Recent Labs    01/05/20 2028 01/06/20 0023 01/06/20 0716  GLUCAP 453* 290* 223*   Hyponatremia-sodium 128 on admission, possibly dry, received IV fluids and improved to 130.  Continue to monitor.  Chronic kidney disease stage 3b-Baseline creatinine ranging  anywhere from 2.0-2.4, currently at baseline  Anemia of chronic disease, chronic renal disease-hemoglobin overall stable, no active bleeding  Obesity-based on a BMI of 30.5  Hyperlipidemia-continue statin  Tobacco use, in remission-continue Chantix, patient quit about a month ago  Scheduled Meds: . amLODipine  10 mg Oral Daily  . aspirin EC  81 mg Oral Daily  . atorvastatin  10 mg Oral Daily  . carvedilol  6.25 mg Oral BID  . heparin  5,000 Units Subcutaneous Q8H  . insulin aspart  0-15 Units Subcutaneous TID WC  . insulin aspart  0-5 Units Subcutaneous QHS  . insulin glargine  20 Units Subcutaneous QHS  . pantoprazole  40 mg Oral Daily  . varenicline  1 mg Oral BID   Continuous Infusions: . ceFEPime (MAXIPIME) IV 2 g (01/06/20 7902)  . [START ON 01/07/2020] vancomycin     PRN Meds:.HYDROcodone-acetaminophen, morphine injection  DVT prophylaxis: heparin Code Status: Full code Family Communication: no family at bedside   Status is: Inpatient  Remains inpatient appropriate because:Requiring repeat surgery tomorrow, maintain on IV antibiotics while monitoring cultures   Dispo: The patient is from: Home              Anticipated d/c is to: Home              Anticipated d/c date is: 2 days              Patient currently is not medically stable to d/c.  Consultants:  Podiatry   Procedures:  REVISION TRANSMETATARSAL AMPUTATION (Left) DEBRIDEMENT WOUND (Left)  Microbiology  None   Antimicrobials: Vancomycin 5/17 >> Cefepime 5/17 >>    Objective: Vitals:   01/05/20 2207 01/06/20 0147  01/06/20 0550 01/06/20 0950  BP: 130/67 102/67 134/81 (!) 110/56  Pulse: 75 76 71 71  Resp: 18 17 18 17   Temp: (!) 97.4 F (36.3 C) 98.2 F (36.8 C) 97.7 F (36.5 C) 97.7 F (36.5 C)  TempSrc: Oral Oral Oral Oral  SpO2: 100% 96% 100% 100%  Weight:      Height:        Intake/Output Summary (Last 24 hours) at 01/06/2020 1021 Last data filed at 01/06/2020 0950 Gross per 24 hour    Intake 2560 ml  Output 450 ml  Net 2110 ml   Filed Weights   01/05/20 1055 01/05/20 1730  Weight: 102.1 kg 102.1 kg    Examination: Constitutional: NAD Eyes: no scleral icterus ENMT: Mucous membranes are moist.  Neck: normal, supple Respiratory: clear to auscultation bilaterally, no wheezing, no crackles. Normal respiratory effort. Cardiovascular: Regular rate and rhythm, no murmurs / rubs / gallops.  Abdomen: non distended, no tenderness.  Musculoskeletal: no clubbing / cyanosis.  Skin: no rashes Neurologic: non focal    Data Reviewed: I have independently reviewed following labs and imaging studies   CBC: Recent Labs  Lab 12/31/19 0853 01/02/20 1538 01/06/20 0445  WBC 8.5 10.1 15.6*  NEUTROABS 5,661  --   --   HGB 9.8* 10.0* 8.9*  HCT 32.5* 33.8* 29.9*  MCV 74.9* 75.3* 74.9*  PLT 703* 691* 921*   Basic Metabolic Panel: Recent Labs  Lab 12/31/19 0853 01/02/20 1538 01/06/20 0445  NA 130* 128* 130*  K 5.3 5.1 5.2*  CL 99 95* 99  CO2 20 23 19*  GLUCOSE 316* 256* 204*  BUN 32* 31* 40*  CREATININE 2.36* 2.33* 2.26*  CALCIUM 10.1 9.3 9.3   Liver Function Tests: Recent Labs  Lab 12/31/19 0853  AST 15  ALT 8*  BILITOT 0.5  PROT 8.6*   Coagulation Profile: No results for input(s): INR, PROTIME in the last 168 hours. HbA1C: No results for input(s): HGBA1C in the last 72 hours. CBG: Recent Labs  Lab 01/05/20 1459 01/05/20 1651 01/05/20 2028 01/06/20 0023 01/06/20 0716  GLUCAP 122* 224* 453* 290* 223*    Recent Results (from the past 240 hour(s))  SARS CORONAVIRUS 2 (TAT 6-24 HRS) Nasopharyngeal Nasopharyngeal Swab     Status: None   Collection Time: 12/29/19  9:20 AM   Specimen: Nasopharyngeal Swab  Result Value Ref Range Status   SARS Coronavirus 2 NEGATIVE NEGATIVE Final    Comment: (NOTE) SARS-CoV-2 target nucleic acids are NOT DETECTED. The SARS-CoV-2 RNA is generally detectable in upper and lower respiratory specimens during the acute  phase of infection. Negative results do not preclude SARS-CoV-2 infection, do not rule out co-infections with other pathogens, and should not be used as the sole basis for treatment or other patient management decisions. Negative results must be combined with clinical observations, patient history, and epidemiological information. The expected result is Negative. Fact Sheet for Patients: SugarRoll.be Fact Sheet for Healthcare Providers: https://www.woods-mathews.com/ This test is not yet approved or cleared by the [place] FDA and  has been authorized for detection and/or diagnosis of SARS-CoV-2 by FDA under an Emergency Use Authorization (EUA). This EUA will remain  in effect (meaning this test can be used) for the duration of the COVID-19 declaration under Section 56 4(b)(1) of the Act, 21 U.S.C. section 360bbb-3(b)(1), unless the authorization is terminated or revoked sooner. Performed at [facility], [place] [address] [place], [place] [address]   SARS CORONAVIRUS 2 (TAT 6-24  HRS) Nasopharyngeal Nasopharyngeal Swab     Status: None   Collection Time: 01/02/20  2:55 PM   Specimen: Nasopharyngeal Swab  Result Value Ref Range Status   SARS Coronavirus 2 NEGATIVE NEGATIVE Final    Comment: (NOTE) SARS-CoV-2 target nucleic acids are NOT DETECTED. The SARS-CoV-2 RNA is generally detectable in upper and lower respiratory specimens during the acute phase of infection. Negative results do not preclude SARS-CoV-2 infection, do not rule out co-infections with other pathogens, and should not be used as the sole basis for treatment or other patient management decisions. Negative results must be combined with clinical observations, patient history, and epidemiological information. The expected result is Negative. Fact Sheet for Patients: SugarRoll.be Fact Sheet for Healthcare  Providers: https://www.woods-mathews.com/ This test is not yet approved or cleared by the [place] FDA and  has been authorized for detection and/or diagnosis of SARS-CoV-2 by FDA under an Emergency Use Authorization (EUA). This EUA will remain  in effect (meaning this test can be used) for the duration of the COVID-19 declaration under Section 56 4(b)(1) of the Act, 21 U.S.C. section 360bbb-3(b)(1), unless the authorization is terminated or revoked sooner. Performed at [facility], [place] [address], [place], [place] [address]   Aerobic/Anaerobic Culture (surgical/deep wound)     Status: None (Preliminary result)   Collection Time: 01/05/20  1:39 PM   Specimen: PATH Amputaion Arm/Leg; Tissue  Result Value Ref Range Status   Specimen Description   Final    FOOT LEFT Performed at [facility] [address] [place], [place] [address]    Special Requests   Final    NONE Performed at [facility], [place] [address] [place], [place] [address]    Gram Stain   Final    RARE WBC PRESENT, PREDOMINANTLY PMN RARE GRAM POSITIVE COCCI IN CLUSTERS Performed at [facility], [place] [address] [place], [place] [address]    Culture PENDING  Incomplete   Report Status PENDING  Incomplete  Aerobic/Anaerobic Culture (surgical/deep wound)     Status: None (Preliminary result)   Collection Time: 01/05/20  1:42 PM   Specimen: PATH Bone biopsy; Tissue  Result Value Ref Range Status   Specimen Description   Final    FOOT LEFT BONE CUBOID Performed at [facility] [address] [place], [place] [address]    Special Requests   Final    NONE Performed at [facility], [place] [address], [place], [place] [address]    Gram Stain   Final    RARE WBC PRESENT, PREDOMINANTLY MONONUCLEAR NO ORGANISMS SEEN Performed at [facility], [place] [address], [place], [place] [address]    Culture PENDING   Incomplete   Report Status PENDING  Incomplete  Aerobic/Anaerobic Culture (surgical/deep wound)     Status: None (Preliminary result)   Collection Time: 01/05/20  1:48 PM   Specimen: PATH Bone biopsy; Tissue  Result Value Ref Range Status   Specimen Description   Final    FOOT LEFT LATERAL CUNIEFORM Performed at [facility] [address], [place], [place] [address]    Special Requests   Final    NONE Performed at [facility], [place] [address] [place], [place] [address]    Gram Stain   Final    RARE WBC PRESENT, PREDOMINANTLY PMN NO ORGANISMS SEEN Performed at [facility], [place] [address] [place], [place] [address]  Culture PENDING  Incomplete   Report Status PENDING  Incomplete     Radiology Studies: DG Foot Complete Left  Result Date: 01/05/2020 CLINICAL DATA:  Status post revision transmetatarsal amputation today. EXAM: LEFT FOOT - COMPLETE 3+ VIEW COMPARISON:  Plain films of the left foot 12/30/2019 and MRI 01/03/2020 FINDINGS: Remnants of the metatarsals have been resected. Gas in the soft tissues and surgical staples noted. No unexpected radiopaque foreign body or acute abnormality. IMPRESSION: Status post resection of the metatarsal remnants.  No acute finding. Electronically Signed   By: Inge Rise M.D.   On: 01/05/2020 14:47   Marzetta Board, MD, PhD [facility]  Between 7 am - 7 pm I am available, please contact me via Amion or Securechat  Between 7 pm - 7 am I am not available, please contact night coverage MD/APP via Amion

## 2020-01-06 NOTE — Progress Notes (Signed)
Subjective: POD #1 s/p left foot Lisfranc disarticulation with bone biopsy.  He states he is doing well.  Having intermittent discomfort but currently no pain.  He was started on IV antibiotics yesterday and awaiting wound cultures and wound cultures.  Plan for return to operating tomorrow for further debridement and delayed primary closure. Denies any systemic complaints such as fevers, chills, nausea, vomiting.    Objective: AAO x3, NAD Pulses are palpable. Status post revisional transmetatarsal/Lisfranc disarticulation.  Sutures were staples intact in the central aspect is packed open.  Upon removal there is no purulence only bloody drainage expressed.  Mild edema of the foot but improved compared to prior surgery.  There is no areas of fluctuation crepitation.  No malodor.  There is no significant warmth of the foot or leg.  His wife also states the foot is looking better today compared to prior to surgery. No pain with calf compression, swelling, warmth, erythema  Assessment: 59 year old male status post Lisfranc disarticulation, bone biopsy  Plan: White blood cell count elevated today but could be in response to surgery/infection.  It remained afebrile.  Pain is currently controlled.  I changed the dressing today and irrigated with saline and repacked the wound with saline moistened 4 x 4 followed by Betadine to the incision and a dry sterile dressing.  So far the bone culture from the cuboid as well as lateral cuneiform are negative.  Wound culture growing rare staph aureus.  Continue current antibiotics.  Discussed with him return to operating tomorrow for wound debridement and possible wound closure.  I discussed the surgery as well as alternatives, risks, complications.  He understands that he is still at risk of limb loss.  I do feel that he has adequate circulation as he has palpable pulses and seems to have adequate bleeding during the surgery.  We will plan for surgery tomorrow as  scheduled.  Consent to be signed.  N.p.o. after midnight.  Celesta Gentile, DPM O: [phone] C: [phone]

## 2020-01-06 NOTE — Op Note (Signed)
PATIENT:  Anthony Flowers  59 y.o. male  PRE-OPERATIVE DIAGNOSIS:  ULCER LEFT FOOT  POST-OPERATIVE DIAGNOSIS:  ULCER LEFT FOOT  PROCEDURE:  Procedure(s): REVISION TRANSMETATARSAL AMPUTATION (Left) DEBRIDEMENT WOUND (Left)  SURGEON:  Surgeon(s) and Role:    Trula Slade, DPM - Primary  PHYSICIAN ASSISTANT:   ASSISTANTS: none   ANESTHESIA:   general  EBL:  150 mL   BLOOD ADMINISTERED:none  DRAINS: none   LOCAL MEDICATIONS USED:  OTHER 20 cc lidocaine and marcaine plain  SPECIMEN:  Source of Specimen:  wound culture and bone biopsy of the cuboid/lateral cuneiform   DISPOSITION OF SPECIMEN:  PATHOLOGY  COUNTS:  YES  TOURNIQUET:  * Missing tourniquet times found for documented tourniquets in log: [id number] *  DICTATION: .Viviann Spare Dictation  PLAN OF CARE: Admit to inpatient   PATIENT DISPOSITION:  PACU - hemodynamically stable.   Delay start of Pharmacological VTE agent (>24hrs) due to surgical blood loss or risk of bleeding: no  Indications for surgery: Mr. Porcaro previously underwent transmetatarsal amputation.  The central aspect dehisced.  X-rays were concerning for osteomyelitis and MRI was performed which revealed osteomyelitis of the remaining metatarsals concerning along cuboid, lateral cuneiform.  Also concern for abscess.  Because of this proceed with further surgery.  Originally scheduled for revision of transmetatarsal amputation however the day of the procedure we discussed with him removal of any metatarsals, bone biopsy, I&D.  We discussed the surgery as well as postoperative course and he agrees.  He understands that he is at risk of limb loss.  Consent was signed.  No promises or guarantees got back of the procedure.  Procedure in detail: The patient was both verbally and visually identified by myself, nursing staff, the anesthesia staff in the preoperative holding area.  He was then transferred to the operating room via stretcher and  placed on the table in supine position.  A tourniquet was applied in case however is not employed during the procedure.  He underwent general anesthesia.  Once anesthetized the left lower extremities and scrubbed, prepped, draped in normal sterile fashion.  Timeout was performed.  An incision was made on the area the previous incision ellipsing the area of the central dehiscence to remove the tissue.  The incision was deepened down to bone along the entire incision.  Soft tissues were freed from the metatarsals.  I was not able to identify an abscess or any purulence however there was inflammation tissue mostly along the lateral aspect of the foot.  At this time I removed the remaining metatarsals at the level of the Lisfranc joint.  A wound culture was obtained.  I then debrided all soft tissue utilizing a rongeur as well as a 15 blade scalpel.  The tissue was debrided down to healthy, bleeding tissue.  There was no proximal tracking identified.  The wound was copiously irrigated with saline.  I then utilized a Jamshidi needle in order to take a bone biopsy from the cuboid as well as lateral cuneiform.  Again the incision was copiously irrigated with sterile saline and hemostasis was achieved.  Hemostasis achieved during the procedure with electrocautery as well as ligation throughout the procedure.  Incision was then closed loosely with 3-0 , 2-0 Prolene as well as skin staples but a central area packed open with regular packing.  Dressing was then applied.  He was woken from anesthesia and found to tolerate the procedure well any complications.  Transferred to PACU with vital signs stable and  vascular status intact.  Postop plan:  Admit to inpatient start IV antibiotics and awaiting cultures.  Likely return to operating room Wednesday for wound debridement delayed primary closure.  Await clinical response.

## 2020-01-06 NOTE — Telephone Encounter (Signed)
Verdi states pt is about to leave the hospital AMA and she would like to speak with Dr. Jacqualyn Posey. Dr. Jacqualyn Posey spoke with pt and urged pt to stay in the hospital due to possibility of returning to the OR and it would be a more expedient event.

## 2020-01-07 ENCOUNTER — Encounter (HOSPITAL_COMMUNITY): Payer: Self-pay | Admitting: Internal Medicine

## 2020-01-07 ENCOUNTER — Encounter (HOSPITAL_COMMUNITY)
Admission: AD | Disposition: A | Discharge status: Home / Self Care | Payer: Self-pay | Source: Home / Self Care | Attending: Internal Medicine

## 2020-01-07 ENCOUNTER — Inpatient Hospital Stay (HOSPITAL_COMMUNITY): Payer: Medicare HMO | Admitting: Certified Registered"

## 2020-01-07 DIAGNOSIS — M86672 Other chronic osteomyelitis, left ankle and foot: Secondary | ICD-10-CM

## 2020-01-07 HISTORY — PX: WOUND DEBRIDEMENT: SHX247

## 2020-01-07 LAB — BASIC METABOLIC PANEL WITH GFR
Anion gap: 10 (ref 5–15)
BUN: 35 mg/dL — ABNORMAL HIGH (ref 6–20)
CO2: 21 mmol/L — ABNORMAL LOW (ref 22–32)
Calcium: 9.3 mg/dL (ref 8.9–10.3)
Chloride: 100 mmol/L (ref 98–111)
Creatinine, Ser: 1.84 mg/dL — ABNORMAL HIGH (ref 0.61–1.24)
GFR calc Af Amer: 45 mL/min — ABNORMAL LOW (ref >60)
GFR calc non Af Amer: 39 mL/min — ABNORMAL LOW (ref >60)
Glucose, Bld: 113 mg/dL — ABNORMAL HIGH (ref 70–99)
Potassium: 4.7 mmol/L (ref 3.5–5.1)
Sodium: 131 mmol/L — ABNORMAL LOW (ref 135–145)

## 2020-01-07 LAB — CBC WITH DIFFERENTIAL/PLATELET
Abs Immature Granulocytes: 0.14 K/uL — ABNORMAL HIGH (ref 0.00–0.07)
Basophils Absolute: 0.1 K/uL (ref 0.0–0.1)
Basophils Relative: 1 %
Eosinophils Absolute: 0.1 K/uL (ref 0.0–0.5)
Eosinophils Relative: 2 %
HCT: 26.7 % — ABNORMAL LOW (ref 39.0–52.0)
Hemoglobin: 8.1 g/dL — ABNORMAL LOW (ref 13.0–17.0)
Immature Granulocytes: 2 %
Lymphocytes Relative: 33 %
Lymphs Abs: 2.8 K/uL (ref 0.7–4.0)
MCH: 22.6 pg — ABNORMAL LOW (ref 26.0–34.0)
MCHC: 30.3 g/dL (ref 30.0–36.0)
MCV: 74.6 fL — ABNORMAL LOW (ref 80.0–100.0)
Monocytes Absolute: 0.7 K/uL (ref 0.1–1.0)
Monocytes Relative: 8 %
Neutro Abs: 4.5 K/uL (ref 1.7–7.7)
Neutrophils Relative %: 54 %
Platelets: 582 K/uL — ABNORMAL HIGH (ref 150–400)
RBC: 3.58 MIL/uL — ABNORMAL LOW (ref 4.22–5.81)
RDW: 16.6 % — ABNORMAL HIGH (ref 11.5–15.5)
WBC: 8.3 K/uL (ref 4.0–10.5)
nRBC: 0.2 % (ref 0.0–0.2)

## 2020-01-07 LAB — GLUCOSE, CAPILLARY
Glucose-Capillary: 128 mg/dL — ABNORMAL HIGH (ref 70–99)
Glucose-Capillary: 158 mg/dL — ABNORMAL HIGH (ref 70–99)
Glucose-Capillary: 158 mg/dL — ABNORMAL HIGH (ref 70–99)
Glucose-Capillary: 123 mg/dL — ABNORMAL HIGH (ref 70–99)
Glucose-Capillary: 93 mg/dL (ref 70–99)
Glucose-Capillary: 284 mg/dL — ABNORMAL HIGH (ref 70–99)

## 2020-01-07 LAB — IRON AND TIBC
Iron: 86 ug/dL (ref 45–182)
Saturation Ratios: 42 % — ABNORMAL HIGH (ref 17.9–39.5)
TIBC: 203 ug/dL — ABNORMAL LOW (ref 250–450)
UIBC: 117 ug/dL

## 2020-01-07 LAB — FERRITIN: Ferritin: 257 ng/mL (ref 24–336)

## 2020-01-07 LAB — SURGICAL PCR SCREEN
MRSA, PCR: NEGATIVE
Staphylococcus aureus: NEGATIVE

## 2020-01-07 SURGERY — DEBRIDEMENT, WOUND
Anesthesia: General | Site: Foot | Laterality: Left

## 2020-01-07 MED ORDER — EPHEDRINE SULFATE-NACL 50-0.9 MG/10ML-% IV SOSY
PREFILLED_SYRINGE | INTRAVENOUS | Status: DC | PRN
  Administered 2020-01-07: 10 mg via INTRAVENOUS

## 2020-01-07 MED ORDER — SODIUM CHLORIDE 0.9 % IV SOLN
INTRAVENOUS | Status: DC | PRN
  Administered 2020-01-07: 250 mL via INTRAVENOUS

## 2020-01-07 MED ORDER — LIDOCAINE 2% (20 MG/ML) 5 ML SYRINGE
INTRAMUSCULAR | Status: AC
  Filled 2020-01-07: qty 5

## 2020-01-07 MED ORDER — LACTATED RINGERS IV SOLN: INTRAVENOUS | Status: DC

## 2020-01-07 MED ORDER — SODIUM CHLORIDE 0.9 % IR SOLN
Status: DC | PRN
  Administered 2020-01-07: 1000 mL

## 2020-01-07 MED ORDER — DEXAMETHASONE SODIUM PHOSPHATE 10 MG/ML IJ SOLN
INTRAMUSCULAR | Status: DC | PRN
  Administered 2020-01-07: 4 mg via INTRAVENOUS

## 2020-01-07 MED ORDER — LIDOCAINE 2% (20 MG/ML) 5 ML SYRINGE
INTRAMUSCULAR | Status: DC | PRN
  Administered 2020-01-07: 100 mg via INTRAVENOUS

## 2020-01-07 MED ORDER — MIDAZOLAM HCL 2 MG/2ML IJ SOLN
INTRAMUSCULAR | Status: AC
  Filled 2020-01-07: qty 2

## 2020-01-07 MED ORDER — PROPOFOL 10 MG/ML IV BOLUS
INTRAVENOUS | Status: DC | PRN
  Administered 2020-01-07: 200 mg via INTRAVENOUS

## 2020-01-07 MED ORDER — ONDANSETRON HCL 4 MG/2ML IJ SOLN: 4.0000 mg | Freq: Once | INTRAMUSCULAR | Status: DC | PRN

## 2020-01-07 MED ORDER — OXYCODONE HCL 5 MG PO TABS: 5.0000 mg | ORAL_TABLET | Freq: Once | ORAL | Status: DC | PRN

## 2020-01-07 MED ORDER — LIDOCAINE HCL (PF) 1 % IJ SOLN
INTRAMUSCULAR | Status: AC
  Filled 2020-01-07: qty 30

## 2020-01-07 MED ORDER — 0.9 % SODIUM CHLORIDE (POUR BTL) OPTIME
TOPICAL | Status: DC | PRN
  Administered 2020-01-07: 1000 mL

## 2020-01-07 MED ORDER — FENTANYL CITRATE (PF) 100 MCG/2ML IJ SOLN
INTRAMUSCULAR | Status: AC
  Filled 2020-01-07: qty 2

## 2020-01-07 MED ORDER — ONDANSETRON HCL 4 MG/2ML IJ SOLN
INTRAMUSCULAR | Status: AC
  Filled 2020-01-07: qty 2

## 2020-01-07 MED ORDER — OXYCODONE HCL 5 MG/5ML PO SOLN: 5.0000 mg | Freq: Once | ORAL | Status: DC | PRN

## 2020-01-07 MED ORDER — BUPIVACAINE HCL (PF) 0.5 % IJ SOLN
INTRAMUSCULAR | Status: DC | PRN
  Administered 2020-01-07: 10 mL

## 2020-01-07 MED ORDER — FENTANYL CITRATE (PF) 100 MCG/2ML IJ SOLN: 25.0000 ug | INTRAMUSCULAR | Status: DC | PRN

## 2020-01-07 MED ORDER — PROPOFOL 10 MG/ML IV BOLUS
INTRAVENOUS | Status: AC
  Filled 2020-01-07: qty 20

## 2020-01-07 MED ORDER — MIDAZOLAM HCL 2 MG/2ML IJ SOLN
INTRAMUSCULAR | Status: DC | PRN
  Administered 2020-01-07: 2 mg via INTRAVENOUS

## 2020-01-07 MED ORDER — LACTATED RINGERS IV SOLN: INTRAVENOUS | Status: DC | PRN

## 2020-01-07 MED ORDER — DEXAMETHASONE SODIUM PHOSPHATE 10 MG/ML IJ SOLN
INTRAMUSCULAR | Status: AC
  Filled 2020-01-07: qty 1

## 2020-01-07 MED ORDER — FENTANYL CITRATE (PF) 100 MCG/2ML IJ SOLN
INTRAMUSCULAR | Status: DC | PRN
  Administered 2020-01-07 (×2): 50 ug via INTRAVENOUS

## 2020-01-07 MED ORDER — BUPIVACAINE HCL (PF) 0.5 % IJ SOLN
INTRAMUSCULAR | Status: AC
  Filled 2020-01-07: qty 30

## 2020-01-07 MED ORDER — ONDANSETRON HCL 4 MG/2ML IJ SOLN
INTRAMUSCULAR | Status: DC | PRN
  Administered 2020-01-07: 4 mg via INTRAVENOUS

## 2020-01-07 MED ORDER — LIDOCAINE HCL 1 % IJ SOLN
INTRAMUSCULAR | Status: DC | PRN
  Administered 2020-01-07: 10 mL

## 2020-01-07 SURGICAL SUPPLY — 51 items
BLADE SURG 15 STRL LF DISP TIS (BLADE) ×2 IMPLANT
BLADE SURG 15 STRL SS (BLADE) ×4
BNDG CMPR 9X4 STRL LF SNTH (GAUZE/BANDAGES/DRESSINGS) ×1
BNDG CONFORM 2 STRL LF (GAUZE/BANDAGES/DRESSINGS) ×2 IMPLANT
BNDG ELASTIC 3X5.8 VLCR STR LF (GAUZE/BANDAGES/DRESSINGS) ×2 IMPLANT
BNDG ELASTIC 4X5.8 VLCR STR LF (GAUZE/BANDAGES/DRESSINGS) ×1 IMPLANT
BNDG ELASTIC 6X5.8 VLCR STR LF (GAUZE/BANDAGES/DRESSINGS) ×2 IMPLANT
BNDG ESMARK 4X9 LF (GAUZE/BANDAGES/DRESSINGS) ×2 IMPLANT
BNDG GAUZE ELAST 4 BULKY (GAUZE/BANDAGES/DRESSINGS) ×2 IMPLANT
BUR EGG ELITE 4.0 (BURR) ×2 IMPLANT
COVER BACK TABLE 60X90IN (DRAPES) ×2 IMPLANT
COVER WAND RF STERILE (DRAPES) IMPLANT
CUFF TOURN SGL QUICK 18X4 (TOURNIQUET CUFF) IMPLANT
DRAPE EXTREMITY T 121X128X90 (DISPOSABLE) ×2 IMPLANT
DRAPE IMP U-DRAPE 54X76 (DRAPES) ×2 IMPLANT
DRAPE OEC MINIVIEW 54X84 (DRAPES) ×2 IMPLANT
DRESSING PREVENA PLUS CUSTOM (GAUZE/BANDAGES/DRESSINGS) IMPLANT
DRSG EMULSION OIL 3X3 NADH (GAUZE/BANDAGES/DRESSINGS) ×2 IMPLANT
DRSG PREVENA PLUS CUSTOM (GAUZE/BANDAGES/DRESSINGS) ×2
DURAPREP 26ML APPLICATOR (WOUND CARE) IMPLANT
ELECT REM PT RETURN 15FT ADLT (MISCELLANEOUS) ×1 IMPLANT
GAUZE 4X4 16PLY RFD (DISPOSABLE) IMPLANT
GAUZE SPONGE 4X4 12PLY STRL (GAUZE/BANDAGES/DRESSINGS) ×2 IMPLANT
GLOVE BIO SURGEON STRL SZ7.5 (GLOVE) ×4 IMPLANT
GLOVE BIOGEL PI IND STRL 7.5 (GLOVE) ×1 IMPLANT
GLOVE BIOGEL PI INDICATOR 7.5 (GLOVE) ×1
GLOVE ECLIPSE 7.5 STRL STRAW (GLOVE) ×2 IMPLANT
GOWN STRL REUS W/ TWL LRG LVL3 (GOWN DISPOSABLE) ×1 IMPLANT
GOWN STRL REUS W/TWL LRG LVL3 (GOWN DISPOSABLE) ×2
KIT DRSG PREVENA PLUS 7DAY 125 (MISCELLANEOUS) ×1 IMPLANT
NDL HYPO 25X1 1.5 SAFETY (NEEDLE) ×2 IMPLANT
NDL SAFETY ECLIPSE 18X1.5 (NEEDLE) IMPLANT
NEEDLE HYPO 18GX1.5 SHARP (NEEDLE)
NEEDLE HYPO 25X1 1.5 SAFETY (NEEDLE) ×4 IMPLANT
NS IRRIG 1000ML POUR BTL (IV SOLUTION) ×1 IMPLANT
PADDING CAST ABS 4INX4YD NS (CAST SUPPLIES) ×1
PADDING CAST ABS COTTON 4X4 ST (CAST SUPPLIES) ×1 IMPLANT
PENCIL SMOKE EVACUATOR (MISCELLANEOUS) ×2 IMPLANT
STAPLER VISISTAT 35W (STAPLE) ×1 IMPLANT
STOCKINETTE 6  STRL (DRAPES) ×2
STOCKINETTE 6 STRL (DRAPES) ×1 IMPLANT
SUT MNCRL AB 3-0 PS2 18 (SUTURE) ×2 IMPLANT
SUT MNCRL AB 4-0 PS2 18 (SUTURE) IMPLANT
SUT MON AB 5-0 PS2 18 (SUTURE) IMPLANT
SUT PROLENE 2 0 CT2 30 (SUTURE) ×1 IMPLANT
SUT PROLENE 3 0 PS 2 (SUTURE) ×3 IMPLANT
SYR 10ML LL (SYRINGE) ×1 IMPLANT
SYR BULB EAR ULCER 3OZ GRN STR (SYRINGE) ×2 IMPLANT
SYR CONTROL 10ML LL (SYRINGE) ×4 IMPLANT
TUBING CONNECTING 10 (TUBING) ×1 IMPLANT
UNDERPAD 30X36 HEAVY ABSORB (UNDERPADS AND DIAPERS) ×2 IMPLANT

## 2020-01-07 NOTE — Transfer of Care (Signed)
Immediate Anesthesia Transfer of Care Note  Patient: Anthony Flowers  Procedure(s) Performed: DEBRIDEMENT WOUND AND PLACEMENT OF WOUND VAC (Left Foot)  Patient Location: PACU  Anesthesia Type:General  Level of Consciousness: awake, alert  and oriented  Airway & Oxygen Therapy: Patient Spontanous Breathing and Patient connected to face mask oxygen  Post-op Assessment: Report given to RN, Post -op Vital signs reviewed and stable and Patient moving all extremities X 4  Post vital signs: Reviewed and stable  Last Vitals:  Vitals Value Taken Time  BP    Temp    Pulse 57 01/07/20 1510  Resp 15 01/07/20 1510  SpO2 100 % 01/07/20 1510  Vitals shown include unvalidated device data.  Last Pain:  Vitals:   01/07/20 1326  TempSrc:   PainSc: 6       Patients Stated Pain Goal: 2 (23/76/28 3151)  Complications: No apparent anesthesia complications

## 2020-01-07 NOTE — Progress Notes (Signed)
PROGRESS NOTE    Anthony Flowers  XBM:[id number] DOB: [birth year] DOA: 01/05/2020 PCP: Julian Hy, PA-C   Brief Narrative:  Anthony Flowers is a 59 y.o. male with medical history significant of diabetes type 2, CKD stage III-4, hyperlipidemia, hypertension, diabetic foot ulcers who was admitted from PACU as per request of Dr. Jacqualyn Posey.  He underwent elective revision transmetatarsal amputation and debridement of the left foot ulcer by Dr. Jacqualyn Posey . Patient had  initially underwent left foot TMA on 11/19/2019 but MRI done on 01/03/20 showed acute osteomyelitis involving the bases of all residual metatarsals,lobulated 4.4 cm complex fluid collection  at the level of the fifth metatarsal base  so he  underwent resection of the remaining metatarsals and bone biopsy of the questionable wounds .  Dr. Earleen Newport is planning for resurgery for debridement/closure .  Assessment & Plan:   Principal Problem:   DM foot ulcer ([place]) Active Problems:   Essential hypertension, benign   Diabetic neuropathy ([place])   Diabetes ([place])   Hyperlipidemia   Type 2 diabetes mellitus with stage 4 chronic kidney disease, with long-term current use of insulin ([place])   Osteomyelitis ([place])   Left foot osteomyelitis/diabetic foot ulcer: He underwent left foot TMA on 11/19/2019 but MRI done on 01/03/20 showed acute osteomyelitis involving the bases of all residual metatarsals,lobulated 4.4 cm complex fluid collection  at the level of the fifth metatarsal base  so he  underwent resection of the remaining metatarsals and bone biopsy of the questionable wounds today. Started on vancomycin and Zosyn.  Wound cultures and blood cultures will be followed.  Wound culture showing rare Staph aureus. Dr. Jacqualyn Posey  following on the patient.  Planning for redebridement/wound closure today. He has history of chronic diabetic foot infection and has underwent several procedures in the past. He had elevated white cell counts which has  resolved.  Hypertension: Currently blood pressure stable.  Continue current medications.  Hyponatremia:Improved with IV fluids.  He takes hydrochlorothiazide at home which is on hold.  Insulin-dependent diabetes mellitus: Poorly controled.  Insulin restarted.  Monitor CBGs.  Insulin will be titrated as per CBGs.  Hemoglobin A1c of 8.8.  Hyperlipidemia: On statin  AKI on CKD stage 4: Baseline creatinine around 2.    Currently kidney function at baseline and improved with IV fluids.  Anemia of chronic disease: Currently hemoglobin stable.  No active bleeding.  Obesity: BMI of 30.5.  Tobacco abuse: Currently on Chantix.  Patient quit tobacco about a month ago         DVT prophylaxis:[place] heparin Code Status: Full Family Communication: Discussed with the patient Status is: Inpatient  Remains inpatient appropriate because:Plan for surgery today   Dispo: The patient is from: Home              Anticipated d/c is to: Home              Anticipated d/c date is: 1 day              Patient currently is not medically stable to d/c.    Consultants:  Podiatry Procedures: Revision transmetatarsal amputation of the left foot wound with debridement  Antimicrobials:  Anti-infectives (From admission, onward)   Start     Dose/Rate Route Frequency Ordered Stop   01/07/20 0800  vancomycin (VANCOREADY) IVPB 1750 mg/350 mL     1,750 mg 175 mL/hr over 120 Minutes Intravenous Every 48 hours 01/05/20 1756     01/05/20 2000  ceFEPIme (MAXIPIME) 2 g  in sodium chloride 0.9 % 100 mL IVPB     2 g 200 mL/hr over 30 Minutes Intravenous Every 12 hours 01/05/20 1754     01/05/20 1530  ceFEPIme (MAXIPIME) 2 g in sodium chloride 0.9 % 100 mL IVPB  Status:  Discontinued     2 g 200 mL/hr over 30 Minutes Intravenous Every 12 hours 01/05/20 1521 01/05/20 1526   01/05/20 0600  vancomycin (VANCOREADY) IVPB 1500 mg/300 mL     1,500 mg 150 mL/hr over 120 Minutes Intravenous On call to O.R. 01/04/20  0929 01/05/20 1848      Subjective: Patient seen and examined at the bedside this morning.  Hemodynamically stable.  Comfortable.  Waiting for surgery  Objective: Vitals:   01/06/20 0950 01/06/20 1408 01/06/20 2203 01/07/20 0554  BP: (!) 110/56 (!) 116/57 (!) 163/149 131/74  Pulse: 71 70 (!) 102 72  Resp: 17 17 18 18   Temp: 97.7 F (36.5 C) 98 F (36.7 C) 97.6 F (36.4 C) 98.1 F (36.7 C)  TempSrc: Oral Oral Oral Oral  SpO2: 100% 100% 100% 100%  Weight:      Height:        Intake/Output Summary (Last 24 hours) at 01/07/2020 5830 Last data filed at 01/07/2020 0601 Gross per 24 hour  Intake 1070 ml  Output 2250 ml  Net -1180 ml   Filed Weights   01/05/20 1055 01/05/20 1730  Weight: 102.1 kg 102.1 kg    Examination:  General exam: Appears calm and comfortable ,Not in distress,obese HEENT:PERRL,Oral mucosa moist, Ear/Nose normal on gross exam Respiratory system: Bilateral equal air entry, normal vesicular breath sounds, no wheezes or crackles  Cardiovascular system: S1 & S2 heard, RRR. No JVD, murmurs, rubs, gallops or clicks. No pedal edema. Gastrointestinal system: Abdomen is nondistended, soft and nontender. No organomegaly or masses felt. Normal bowel sounds heard. Central nervous system: Alert and oriented. No focal neurological deficits. Extremities: Left foot wrapped with dressing Skin: No rashes, lesions or ulcers,no icterus ,no pallor   Data Reviewed: I have personally reviewed following labs and imaging studies  CBC: Recent Labs  Lab 12/31/19 0853 01/02/20 1538 01/06/20 0445  WBC 8.5 10.1 15.6*  NEUTROABS 5,661  --   --   HGB 9.8* 10.0* 8.9*  HCT 32.5* 33.8* 29.9*  MCV 74.9* 75.3* 74.9*  PLT 703* 691* 940*   Basic Metabolic Panel: Recent Labs  Lab 12/31/19 0853 01/02/20 1538 01/06/20 0445  NA 130* 128* 130*  K 5.3 5.1 5.2*  CL 99 95* 99  CO2 20 23 19*  GLUCOSE 316* 256* 204*  BUN 32* 31* 40*  CREATININE 2.36* 2.33* 2.26*  CALCIUM 10.1  9.3 9.3   GFR: Estimated Creatinine Clearance: 43.5 mL/min (A) (by C-G formula based on SCr of 2.26 mg/dL (H)). Liver Function Tests: Recent Labs  Lab 12/31/19 0853  AST 15  ALT 8*  BILITOT 0.5  PROT 8.6*   No results for input(s): LIPASE, AMYLASE in the last 168 hours. No results for input(s): AMMONIA in the last 168 hours. Coagulation Profile: No results for input(s): INR, PROTIME in the last 168 hours. Cardiac Enzymes: No results for input(s): CKTOTAL, CKMB, CKMBINDEX, TROPONINI in the last 168 hours. BNP (last 3 results) No results for input(s): PROBNP in the last 8760 hours. HbA1C: No results for input(s): HGBA1C in the last 72 hours. CBG: Recent Labs  Lab 01/06/20 0716 01/06/20 1154 01/06/20 1631 01/06/20 2245 01/07/20 0745  GLUCAP 223* 271* 315* 196* 128*  Lipid Profile: No results for input(s): CHOL, HDL, LDLCALC, TRIG, CHOLHDL, LDLDIRECT in the last 72 hours. Thyroid Function Tests: No results for input(s): TSH, T4TOTAL, FREET4, T3FREE, THYROIDAB in the last 72 hours. Anemia Panel: No results for input(s): VITAMINB12, FOLATE, FERRITIN, TIBC, IRON, RETICCTPCT in the last 72 hours. Sepsis Labs: No results for input(s): PROCALCITON, LATICACIDVEN in the last 168 hours.  Recent Results (from the past 240 hour(s))  SARS CORONAVIRUS 2 (TAT 6-24 HRS) Nasopharyngeal Nasopharyngeal Swab     Status: None   Collection Time: 12/29/19  9:20 AM   Specimen: Nasopharyngeal Swab  Result Value Ref Range Status   SARS Coronavirus 2 NEGATIVE NEGATIVE Final    Comment: (NOTE) SARS-CoV-2 target nucleic acids are NOT DETECTED. The SARS-CoV-2 RNA is generally detectable in upper and lower respiratory specimens during the acute phase of infection. Negative results do not preclude SARS-CoV-2 infection, do not rule out co-infections with other pathogens, and should not be used as the sole basis for treatment or other patient management decisions. Negative results must be combined  with clinical observations, patient history, and epidemiological information. The expected result is Negative. Fact Sheet for Patients: SugarRoll.be Fact Sheet for Healthcare Providers: https://www.woods-mathews.com/ This test is not yet approved or cleared by the [place] FDA and  has been authorized for detection and/or diagnosis of SARS-CoV-2 by FDA under an Emergency Use Authorization (EUA). This EUA will remain  in effect (meaning this test can be used) for the duration of the COVID-19 declaration under Section 56 4(b)(1) of the Act, 21 U.S.C. section 360bbb-3(b)(1), unless the authorization is terminated or revoked sooner. Performed at [facility], [place] [address] [place], [place] [address]   SARS CORONAVIRUS 2 (TAT 6-24 HRS) Nasopharyngeal Nasopharyngeal Swab     Status: None   Collection Time: 01/02/20  2:55 PM   Specimen: Nasopharyngeal Swab  Result Value Ref Range Status   SARS Coronavirus 2 NEGATIVE NEGATIVE Final    Comment: (NOTE) SARS-CoV-2 target nucleic acids are NOT DETECTED. The SARS-CoV-2 RNA is generally detectable in upper and lower respiratory specimens during the acute phase of infection. Negative results do not preclude SARS-CoV-2 infection, do not rule out co-infections with other pathogens, and should not be used as the sole basis for treatment or other patient management decisions. Negative results must be combined with clinical observations, patient history, and epidemiological information. The expected result is Negative. Fact Sheet for Patients: SugarRoll.be Fact Sheet for Healthcare Providers: https://www.woods-mathews.com/ This test is not yet approved or cleared by the [place] FDA and  has been authorized for detection and/or diagnosis of SARS-CoV-2 by FDA under an Emergency Use Authorization (EUA). This EUA will remain  in effect (meaning this test  can be used) for the duration of the COVID-19 declaration under Section 56 4(b)(1) of the Act, 21 U.S.C. section 360bbb-3(b)(1), unless the authorization is terminated or revoked sooner. Performed at [facility], [place] [address] [place], [place] [address]   Aerobic/Anaerobic Culture (surgical/deep wound)     Status: None (Preliminary result)   Collection Time: 01/05/20  1:39 PM   Specimen: PATH Amputaion Arm/Leg; Tissue  Result Value Ref Range Status   Specimen Description   Final    FOOT LEFT Performed at [place] [address], [place], [place] [address]    Special Requests   Final    NONE Performed at [facility], [place] [address] [place], [place] [address]    Gram Stain  Final    RARE WBC PRESENT, PREDOMINANTLY PMN RARE GRAM POSITIVE COCCI IN CLUSTERS    Culture   Final    RARE STAPHYLOCOCCUS AUREUS CULTURE REINCUBATED FOR BETTER GROWTH Performed at [facility], [place] [address], [place], [place] [address]    Report Status PENDING  Incomplete  Aerobic/Anaerobic Culture (surgical/deep wound)     Status: None (Preliminary result)   Collection Time: 01/05/20  1:42 PM   Specimen: PATH Bone biopsy; Tissue  Result Value Ref Range Status   Specimen Description   Final    FOOT LEFT BONE CUBOID Performed at [place] [address], [place], [place] [address]    Special Requests   Final    NONE Performed at [facility], [place] [address] [place], [place] [address]    Gram Stain   Final    RARE WBC PRESENT, PREDOMINANTLY MONONUCLEAR NO ORGANISMS SEEN    Culture   Final    NO GROWTH < 24 HOURS Performed at [facility], [place] [address], [place], [place] [address]    Report Status PENDING  Incomplete  Aerobic/Anaerobic Culture (surgical/deep wound)     Status: None (Preliminary result)   Collection Time: 01/05/20  1:48 PM   Specimen: PATH Bone biopsy; Tissue  Result  Value Ref Range Status   Specimen Description   Final    FOOT LEFT LATERAL CUNIEFORM Performed at [place] [address], [place], [place] [address]    Special Requests   Final    NONE Performed at [facility], [place] [address], [place], [place] [address]    Gram Stain   Final    RARE WBC PRESENT, PREDOMINANTLY PMN NO ORGANISMS SEEN    Culture   Final    NO GROWTH < 24 HOURS Performed at [place] [address], [place], [place] [address]    Report Status PENDING  Incomplete         Radiology Studies: DG Foot Complete Left  Result Date: 01/05/2020 CLINICAL DATA:  Status post revision transmetatarsal amputation today. EXAM: LEFT FOOT - COMPLETE 3+ VIEW COMPARISON:  Plain films of the left foot 12/30/2019 and MRI 01/03/2020 FINDINGS: Remnants of the metatarsals have been resected. Gas in the soft tissues and surgical staples noted. No unexpected radiopaque foreign body or acute abnormality. IMPRESSION: Status post resection of the metatarsal remnants.  No acute finding. Electronically Signed   By: Inge Rise M.D.   On: 01/05/2020 14:47        Scheduled Meds: . amLODipine  10 mg Oral Daily  . aspirin EC  81 mg Oral Daily  . atorvastatin  10 mg Oral Daily  . carvedilol  6.25 mg Oral BID  . heparin  5,000 Units Subcutaneous Q8H  . insulin aspart  0-15 Units Subcutaneous TID WC  . insulin aspart  0-5 Units Subcutaneous QHS  . insulin glargine  20 Units Subcutaneous QHS  . pantoprazole  40 mg Oral Daily  . varenicline  1 mg Oral BID   Continuous Infusions: . ceFEPime (MAXIPIME) IV 2 g (01/06/20 1950)  . vancomycin       LOS: 2 days    Time spent: 25 mins,More than 50% of that time was spent in counseling and/or coordination of care.      Shelly Coss, MD [facility] P5/19/2021, 8:21 AM

## 2020-01-07 NOTE — Op Note (Signed)
PATIENT:  Anthony Flowers  59 y.o. male  PRE-OPERATIVE DIAGNOSIS:  osteomyelitis, wound  POST-OPERATIVE DIAGNOSIS:  osteomyelitis, wound  PROCEDURE:  Procedure(s) with comments: DEBRIDEMENT WOUND AND PLACEMENT OF WOUND VAC (Left) -  closure with placement of wound vac, partial removal of medial cuneiform   SURGEON:  Surgeon(s) and Role:    * Trula Slade, DPM - Primary  PHYSICIAN ASSISTANT:   ASSISTANTS: none   ANESTHESIA:   general  EBL:  25 mL   BLOOD ADMINISTERED:none  DRAINS: none   LOCAL MEDICATIONS USED:  OTHER 20 cc lidocaine and marcaine plain  SPECIMEN:  No Specimen  DISPOSITION OF SPECIMEN:  N/A  COUNTS:  YES  TOURNIQUET:  * No tourniquets in log *  DICTATION: .Dragon Dictation  PLAN OF CARE: Admit to inpatient   PATIENT DISPOSITION:  PACU - hemodynamically stable.   Delay start of Pharmacological VTE agent (>24hrs) due to surgical blood loss or risk of bleeding: no  Indications for surgery:  59 year old male previous underwent transmetatarsal amputation.  The central aspect of the wound did dehisce.  MRI was concerning for infection.  Underwent Lisfranc disarticulation, bone biopsy.  Discussed plan return to surgery for wound debridement and delayed primary closure of the wound.  We discussed the surgery as well as postoperative course.  Discussed alternatives, risks, complications.  No promises or guarantees were given effective the procedure and all questions were answered to the best my ability.  Procedure in detail: Patient was both verbally and visually identified by myself and nursing staff and the anesthesia staff preoperatively.  He was then transferred to the operating room via stretcher and placed on the table in supine position.  An LMA was placed.  The left lower extremities and scrubbed, prepped, draped in normal sterile fashion.  Timeout was performed.  I removed the sutures, staples.  I irrigated hematoma which was mild from the wound.   At this time I explored the wound and there is no purulence or signs of an abscess noted.  I utilized a rongeur to debride some nonviable soft tissue mostly on the lateral aspect.  Upon further exploration to get off unable to identify any purulence or significant signs of infection.  The remaining nails appear to be viable.  I also debrided the skin edges down to healthy, bleeding edges.  Hemostasis was achieved.  1 L of saline was utilized to irrigate the wound.  I then closed the wound with Prolene as well as skin staples.  A Prevena wound VAC was applied.  He was awoken from anesthesia and found to tolerate the procedure well any complications.  Transferred to PACU vital signs stable vascular status intact.  He has palpable pulses today.  Postop plan: Awaiting cultures for likely discharge on Thursday with oral antibiotics and will follow Biopsy, Wound Cultures.  I Will Plan on Removing the Wound VAC in the Office on Tuesday of Next Week.

## 2020-01-07 NOTE — Progress Notes (Signed)
Patient seen in [place]. Scheduled for wound debridement, likely delayed primary closure. WBC normal and afebrile. Reviewed wound cultures with him as well. Will plan for surgery as scheduled. Consent signed.

## 2020-01-07 NOTE — Anesthesia Procedure Notes (Signed)
Procedure Name: LMA Insertion Date/Time: 01/07/2020 2:12 PM Performed by: Niel Hummer, CRNA Pre-anesthesia Checklist: Patient identified, Emergency Drugs available, Suction available and Patient being monitored Patient Re-evaluated:Patient Re-evaluated prior to induction Oxygen Delivery Method: Circle system utilized Preoxygenation: Pre-oxygenation with 100% oxygen Induction Type: IV induction LMA: LMA with gastric port inserted LMA Size: 5.0 Number of attempts: 1 Dental Injury: Teeth and Oropharynx as per pre-operative assessment

## 2020-01-07 NOTE — Brief Op Note (Signed)
01/07/2020  3:05 PM  PATIENT:  Anthony Flowers  59 y.o. male  PRE-OPERATIVE DIAGNOSIS:  osteomyelitis, wound  POST-OPERATIVE DIAGNOSIS:  osteomyelitis, wound  PROCEDURE:  Procedure(s) with comments: DEBRIDEMENT WOUND AND PLACEMENT OF WOUND VAC (Left) -  closure with placement of wound vac, partial removal of medial cuneiform   SURGEON:  Surgeon(s) and Role:    * Trula Slade, DPM - Primary  PHYSICIAN ASSISTANT:   ASSISTANTS: none   ANESTHESIA:   general  EBL:  25 mL   BLOOD ADMINISTERED:none  DRAINS: none   LOCAL MEDICATIONS USED:  OTHER 20 cc lidocaine and marcaine plain  SPECIMEN:  No Specimen  DISPOSITION OF SPECIMEN:  N/A  COUNTS:  YES  TOURNIQUET:  * No tourniquets in log *  DICTATION: .Dragon Dictation  PLAN OF CARE: Admit to inpatient   PATIENT DISPOSITION:  PACU - hemodynamically stable.   Delay start of Pharmacological VTE agent (>24hrs) due to surgical blood loss or risk of bleeding: no  Intraoperative findings: No pus or abscess noted. Remaining bone appeared viable. Debrided to healthy tissue. Wound closed primarily with Provena VAC. Likely d/c tomorrow.

## 2020-01-07 NOTE — Anesthesia Preprocedure Evaluation (Signed)
Anesthesia Evaluation  Patient identified by MRN, date of birth, ID band Patient awake    Reviewed: Allergy & Precautions, NPO status , Patient's Chart, lab work & pertinent test results  Airway Mallampati: II  TM Distance: >3 FB Neck ROM: Full    Dental  (+) Edentulous Upper, Edentulous Lower   Pulmonary sleep apnea , former smoker,    Pulmonary exam normal        Cardiovascular hypertension, + Peripheral Vascular Disease  Normal cardiovascular exam     Neuro/Psych negative neurological ROS  negative psych ROS   GI/Hepatic Neg liver ROS, GERD  ,  Endo/Other  diabetes, Insulin Dependent  Renal/GU Renal InsufficiencyRenal disease  negative genitourinary   Musculoskeletal negative musculoskeletal ROS (+)   Abdominal   Peds negative pediatric ROS (+)  Hematology  (+) anemia ,   Anesthesia Other Findings   Reproductive/Obstetrics negative OB ROS                             Anesthesia Physical  Anesthesia Plan  ASA: III  Anesthesia Plan: General   Post-op Pain Management:    Induction: Intravenous  PONV Risk Score and Plan: 2 and Ondansetron, Midazolam, Treatment may vary due to age or medical condition and Dexamethasone  Airway Management Planned: LMA  Additional Equipment: None  Intra-op Plan:   Post-operative Plan: Extubation in OR  Informed Consent: I have reviewed the patients History and Physical, chart, labs and discussed the procedure including the risks, benefits and alternatives for the proposed anesthesia with the patient or authorized representative who has indicated his/her understanding and acceptance.     Dental advisory given  Plan Discussed with:   Anesthesia Plan Comments:         Anesthesia Quick Evaluation

## 2020-01-07 NOTE — Anesthesia Postprocedure Evaluation (Signed)
Anesthesia Post Note  Patient: Anthony Flowers  Procedure(s) Performed: DEBRIDEMENT WOUND AND PLACEMENT OF WOUND VAC (Left Foot)     Patient location during evaluation: PACU Anesthesia Type: General Level of consciousness: awake and alert Pain management: pain level controlled Vital Signs Assessment: post-procedure vital signs reviewed and stable Respiratory status: spontaneous breathing, nonlabored ventilation and respiratory function stable Cardiovascular status: blood pressure returned to baseline and stable Postop Assessment: no apparent nausea or vomiting Anesthetic complications: no    Last Vitals:  Vitals:   01/07/20 1515 01/07/20 1545  BP: 126/71 (!) 147/80  Pulse: (!) 58 64  Resp: 13 11  Temp:  36.6 C  SpO2: 100% 100%    Last Pain:  Vitals:   01/07/20 1545  TempSrc:   PainSc: 0-No pain                 Lidia Collum

## 2020-01-07 NOTE — H&P (Signed)
Anesthesia H&P Update: History and Physical Exam reviewed; patient is OK for planned anesthetic and procedure. ? ?

## 2020-01-08 ENCOUNTER — Encounter: Payer: Self-pay | Admitting: Podiatry

## 2020-01-08 LAB — CBC WITH DIFFERENTIAL/PLATELET
Abs Immature Granulocytes: 0.1 K/uL — ABNORMAL HIGH (ref 0.00–0.07)
Basophils Absolute: 0 K/uL (ref 0.0–0.1)
Basophils Relative: 0 %
Eosinophils Absolute: 0 K/uL (ref 0.0–0.5)
Eosinophils Relative: 0 %
HCT: 27.8 % — ABNORMAL LOW (ref 39.0–52.0)
Hemoglobin: 8.2 g/dL — ABNORMAL LOW (ref 13.0–17.0)
Immature Granulocytes: 1 %
Lymphocytes Relative: 14 %
Lymphs Abs: 2 K/uL (ref 0.7–4.0)
MCH: 22 pg — ABNORMAL LOW (ref 26.0–34.0)
MCHC: 29.5 g/dL — ABNORMAL LOW (ref 30.0–36.0)
MCV: 74.7 fL — ABNORMAL LOW (ref 80.0–100.0)
Monocytes Absolute: 1.1 K/uL — ABNORMAL HIGH (ref 0.1–1.0)
Monocytes Relative: 8 %
Neutro Abs: 10.6 K/uL — ABNORMAL HIGH (ref 1.7–7.7)
Neutrophils Relative %: 77 %
Platelets: 650 K/uL — ABNORMAL HIGH (ref 150–400)
RBC: 3.72 MIL/uL — ABNORMAL LOW (ref 4.22–5.81)
RDW: 16.5 % — ABNORMAL HIGH (ref 11.5–15.5)
WBC: 13.9 K/uL — ABNORMAL HIGH (ref 4.0–10.5)
nRBC: 0.1 % (ref 0.0–0.2)

## 2020-01-08 LAB — GLUCOSE, CAPILLARY: Glucose-Capillary: 160 mg/dL — ABNORMAL HIGH (ref 70–99)

## 2020-01-08 MED ORDER — DOXYCYCLINE MONOHYDRATE 100 MG PO TABS: 100.0000 mg | ORAL_TABLET | Freq: Two times a day (BID) | ORAL | 0 refills | Status: DC

## 2020-01-08 MED ORDER — SACCHAROMYCES BOULARDII 250 MG PO CAPS: 250.0000 mg | ORAL_CAPSULE | Freq: Two times a day (BID) | ORAL | Status: DC

## 2020-01-08 NOTE — TOC Transition Note (Signed)
Transition of Care [facility]) - CM/SW Discharge Note   Patient Details  Name: Anthony Flowers MRN: [id number] Date of Birth: [birth year]  [facility] of [facility]) CM/SW Contact:  Lennart Pall, LCSW Phone Number: 01/08/2020, 10:47 AM   Clinical Narrative:   Pt medically cleared for d/c but needed rolling walker - ordered via [employer].  No f/u HH needed at this time.  Ready for d/c.    Final next level of care: Home/Self Care Barriers to Discharge: Barriers Resolved   Patient Goals and CMS Choice Patient states their goals for this hospitalization and ongoing recovery are:: go home CMS Medicare.gov Compare Post Acute Care list provided to:: Patient Choice offered to / list presented to : Patient  Discharge Placement                       Discharge Plan and Services                DME Arranged: Walker rolling DME Agency: [employer] Date DME Agency Contacted: 01/08/20 Time DME Agency Contacted: 1642 Representative spoke with at DME Agency: [place]: NA [place] Agency: NA        Social Determinants of Health ([place]) Interventions     Readmission Risk Interventions No flowsheet data found.

## 2020-01-08 NOTE — Discharge Summary (Signed)
Physician Discharge Summary  Anthony Flowers SKA:[id number] DOB: Nov 30, 1960 DOA: 01/05/2020  PCP: Julian Hy, PA-C  Admit date: 01/05/2020 Discharge date: 01/08/2020  Admitted From: Home Disposition:  Home  Discharge Condition:Stable CODE STATUS:FULL Diet recommendation: Heart Healthy   Brief/Interim Summary: Anthony Flowers a 59 y.o.malewith medical history significant ofdiabetes type 2, CKD stage III-4, hyperlipidemia, hypertension, diabetic foot ulcers who was admitted from PACU as per request of Dr. Jacqualyn Posey.He underwent elective revision transmetatarsal amputation and debridement of the left foot ulcer by Dr. Jacqualyn Posey .Patient hadinitially underwent left foot TMA on 11/19/2019 but MRI done on 01/03/20 showedacute osteomyelitis involving the bases of all residual metatarsals,lobulated 4.4 cm complex fluid collection at the level of the fifth metatarsal baseso heunderwent resection of the remaining metatarsals and bone biopsy of the questionable wounds . He again underwent debridement of the wound and placement of wound VAC on 01/08/2020.  His wound culture showed MRSA sensitive to tetracycline.  He will be discharged home with 7 days course of doxycycline.  Following problems were addressed during his hospitalization:  Left foot osteomyelitis/diabetic foot ulcer:He has history of chronic diabetic foot infection and has underwent several procedures in the past.Heunderwent left foot TMA on 3/31/2021but MRI done on 01/03/20 showedacute osteomyelitis involving the bases of all residual metatarsals,lobulated 4.4 cm complex fluid collection at the level of the fifth metatarsal baseso heunderwent resection of the remaining metatarsals and bone biopsy of the questionable wounds . Started on vancomycin and Zosyn. Wound culture showed MRSA. He underwent  redebridement/wound closure on 01/07/20. Antibiotics changed to doxycycline which he can continue for 1 week.  He will  follow-up with Dr. Earleen Newport for the wound VAC removal. He has mild leukocytosis.  We recommend to follow-up with PCP do a CBC test during the follow-up in a week.  Hypertension:Currently blood pressure stable. Continue current medications.  Hyponatremia:Improved with IV fluids. He takes hydrochlorothiazide at home .  Insulin-dependent diabetes mellitus: Poorly controled. Hemoglobin A1c of 8.8.  Continue home regimen on discharge.  Hyperlipidemia: On statin  AKI onCKD stage4:Baseline creatinine around 2.   Currently kidney function at baseline   Anemia of chronic disease: Likely associated  with CKD.  Currently hemoglobin stable.  No active bleeding.  Obesity: BMI of 30.5.  Tobacco abuse: Currently on Chantix.  Patient quit tobacco about a month ago     Discharge Diagnoses:  Principal Problem:   DM foot ulcer ([place]) Active Problems:   Essential hypertension, benign   Diabetic neuropathy ([place])   Diabetes ([place])   Hyperlipidemia   Type 2 diabetes mellitus with stage 4 chronic kidney disease, with long-term current use of insulin (HCC)   Osteomyelitis (HCC)    Discharge Instructions  Discharge Instructions    Diet - low sodium heart healthy   Complete by: As directed    Discharge instructions   Complete by: As directed    1)Please follow up with  Dr Jacqualyn Posey in a week,. 2)Follow up with your PCP in a week and do a CBC, BMP test during the follow-up 3)Take prescribed medications as instructed.   Increase activity slowly   Complete by: As directed      Allergies as of 01/08/2020      Reactions   Latex Hives   Tape Hives   Chlorhexidine Itching, Rash   Develops skin irritation   Fish Allergy Hives, Rash   Patient unsure which fish   Penicillins Itching, Swelling, Rash   Did it involve swelling of the face/tongue/throat, SOB, or low  BP? Yes Did it involve sudden or severe rash/hives, skin peeling, or any reaction on the inside of your mouth or nose?  No Did you need to seek medical attention at a hospital or doctor's office? Yes When did it last happen?89-37 years old If all above answers are "NO", may proceed with cephalosporin use.      Medication List    STOP taking these medications   cephALEXin 500 MG capsule Commonly known as: KEFLEX   doxycycline 100 MG tablet Commonly known as: VIBRA-TABS     TAKE these medications   Accu-Chek Guide w/Device Kit   amLODipine 10 MG tablet Commonly known as: NORVASC Take 10 mg by mouth daily.   aspirin EC 81 MG tablet Take 81 mg by mouth daily.   atorvastatin 10 MG tablet Commonly known as: LIPITOR Take 10 mg by mouth daily.   B-D ULTRAFINE III SHORT PEN 31G X 8 MM Misc Generic drug: Insulin Pen Needle 4 (four) times daily. for testing   bismuth subsalicylate 460 QN/99YX suspension Commonly known as: PEPTO BISMOL Take 30 mLs by mouth every 6 (six) hours as needed for indigestion.   carvedilol 6.25 MG tablet Commonly known as: COREG Take 6.25 mg by mouth 2 (two) times daily.   Chantix Continuing Month Pak 1 MG tablet Generic drug: varenicline Take 1 mg by mouth 2 (two) times daily.   doxycycline 100 MG tablet Commonly known as: ADOXA Take 1 tablet (100 mg total) by mouth 2 (two) times daily.   glucose blood test strip Commonly known as: OneTouch Verio 1 each by Other route 3 (three) times daily. And lancets 3/day   HYDROcodone-acetaminophen 10-325 MG tablet Commonly known as: NORCO Take 1 tablet by mouth every 6 (six) hours as needed for moderate pain. What changed: Another medication with the same name was removed. Continue taking this medication, and follow the directions you see here.   insulin aspart 100 UNIT/ML FlexPen Commonly known as: NovoLOG FlexPen Inject 25 Units into the skin 3 (three) times daily with meals. And pen needles 4/day What changed: how much to take   Lantus SoloStar 100 UNIT/ML Solostar Pen Generic drug: insulin glargine Inject  20 Units into the skin at bedtime. What changed: how much to take   lidocaine 5 % Commonly known as: Lidoderm Place 1 patch onto the skin daily. Remove & Discard patch within 12 hours or as directed by MD What changed:   when to take this  reasons to take this   omeprazole 40 MG capsule Commonly known as: PRILOSEC Take 40 mg by mouth daily.   Ozempic (1 MG/DOSE) 2 MG/1.5ML Sopn Generic drug: Semaglutide (1 MG/DOSE) Inject 1 mg into the skin every 'Sunday.   Santyl ointment Generic drug: collagenase Apply 1 application topically daily.   valsartan-hydrochlorothiazide 160-25 MG tablet Commonly known as: DIOVAN-HCT Take 1 tablet by mouth daily.            Durable Medical Equipment  (From admission, onward)         Start     Ordered   01/08/20 1044  For home use only DME Walker rolling  Once    Question Answer Comment  Walker: With 5 Inch Wheels   Patient needs a walker to treat with the following condition Amputation at midfoot, left, initial encounter (HCC)      05' /20/21 1045         Follow-up Information    Bujanowski, Melissa, PA-C. Schedule an appointment as soon as possible  for a visit in 1 week(s).   Specialty: Physician Assistant Contact information: [place] [address] [phone]          Allergies  Allergen Reactions  . Latex Hives  . Tape Hives  . Chlorhexidine Itching and Rash    Develops skin irritation   . Fish Allergy Hives and Rash    Patient unsure which fish  . Penicillins Itching, Swelling and Rash    Did it involve swelling of the face/tongue/throat, SOB, or low BP? Yes Did it involve sudden or severe rash/hives, skin peeling, or any reaction on the inside of your mouth or nose? No Did you need to seek medical attention at a hospital or doctor's office? Yes When did it last happen?70-54 years old If all above answers are "NO", may proceed with cephalosporin use.      Consultations:  podiatry   Procedures/Studies: MR FOOT LEFT WO CONTRAST  Result Date: 01/03/2020 CLINICAL DATA:  Diabetic foot ulcer. Concern for osteomyelitis. History of transmetatarsal amputation EXAM: MRI OF THE LEFT FOOT WITHOUT CONTRAST TECHNIQUE: Multiplanar, multisequence MR imaging of the left hindfoot was performed. No intravenous contrast was administered. COMPARISON:  X-ray 12/30/2019 FINDINGS: Bones/Joint/Cartilage Patient is status post transmetatarsal amputation of the first-fifth rays at the level of the proximal metatarsal metaphyses. There is bone marrow edema within the residual metatarsal bases most prominently involving the third, fourth, and fifth metatarsal bases with associated confluent low T1 signal compatible with osteomyelitis. There is more subtle marrow edema at the resection margins of the first and second metatarsals with low T1 signal along the distal most aspect of the resection margin. Bone marrow edema is seen within the lateral aspect of the cuboid as well as within the more distal portion of the lateral cuneiform without definite T1 marrow signal changes. There is a small amount of joint fluid within the fourth and fifth TMT joints concerning for septic arthritis. Osseous alignment is anatomic without dislocation. No acute fracture. Similar midfoot osteoarthritis with dorsal hypertrophy and reactive subchondral marrow changes which is most pronounced at the second tarsometatarsal joint. The marrow signal of the hindfoot and ankle is maintained. Trace subtalar joint effusion, nonspecific. Ligaments The anterior and posterior tibiofibular ligaments are intact. The anterior and posterior talofibular ligaments are intact. Intact calcaneofibular ligament. Deltoid and spring ligaments grossly intact. LisFranc ligament intact. Muscles and Tendons Mild intramuscular edema of the remaining foot musculature. Postsurgical changes to the flexor and extensor tendons related to  amputation. Grossly intact peroneal tendons with mild tenosynovial fluid. No focal abnormality of the posteromedial tendons. Intact Achilles tendon. Soft tissues Lobulated complex fluid collection overlying the lateral aspect of the foot at the level of the fifth metatarsal base. Fluid collection measures up to 4.4 x 1.1 x 1.0 cm (series 4, image 32; series 5, image 35). Collection closely approximates the fifth TMT joint. There is skin irregularity overlying the distal stump laterally suggesting ulceration or dehiscence. Overlying skin thickening and soft tissue edema, likely cellulitis. IMPRESSION: 1. Status post transmetatarsal amputation of the first-fifth rays with acute osteomyelitis involving the bases of all residual metatarsals, worst at the third-fifth metatarsal bases. 2. Small amount of joint fluid within the fourth and fifth TMT joints, concerning for septic arthritis. 3. Lobulated 4.4 cm complex fluid collection overlying the lateral aspect of the foot at the level of the fifth metatarsal base. Collection closely approximates the fifth TMT joint. Findings are suspicious for abscess. 4. Patchy marrow edema within the cuboid and  lateral cuneiform without T1 marrow signal abnormality. Findings suggesting reactive osteitis. Changes related to early acute osteomyelitis are not excluded. These results will be called to the ordering clinician or representative by the Radiologist Assistant, and communication documented in the PACS or [employer]. Electronically Signed   By: Davina Poke D.O.   On: 01/03/2020 11:18   DG Foot Complete Left  Result Date: 01/05/2020 CLINICAL DATA:  Status post revision transmetatarsal amputation today. EXAM: LEFT FOOT - COMPLETE 3+ VIEW COMPARISON:  Plain films of the left foot 12/30/2019 and MRI 01/03/2020 FINDINGS: Remnants of the metatarsals have been resected. Gas in the soft tissues and surgical staples noted. No unexpected radiopaque foreign body or acute  abnormality. IMPRESSION: Status post resection of the metatarsal remnants.  No acute finding. Electronically Signed   By: Inge Rise M.D.   On: 01/05/2020 14:47   DG Foot Complete Left  Result Date: 12/30/2019 Please see detailed radiograph report in office note.  DG Foot Complete Left  Result Date: 12/26/2019 Please see detailed radiograph report in office note.      Subjective: Patient seen and examined at the bedside this morning.  Hemodynamically stable for discharge today.  Discharge Exam: Vitals:   01/08/20 0744 01/08/20 1023  BP: 137/81 (!) 141/73  Pulse: 80 76  Resp:    Temp:  97.8 F (36.6 C)  SpO2:  98%   Vitals:   01/07/20 2245 01/08/20 0513 01/08/20 0744 01/08/20 1023  BP: 123/74 (!) 141/70 137/81 (!) 141/73  Pulse: 71 75 80 76  Resp:  18    Temp: (!) 97.5 F (36.4 C) (!) 97.5 F (36.4 C)  97.8 F (36.6 C)  TempSrc: Oral Oral  Oral  SpO2: 100% 99%  98%  Weight:      Height:        General: Pt is alert, awake, not in acute distress Cardiovascular: RRR, S1/S2 +, no rubs, no gallops Respiratory: CTA bilaterally, no wheezing, no rhonchi Abdominal: Soft, NT, ND, bowel sounds + Extremities: no edema, no cyanosis, left foot wrapped with dressing, wound VAC.    The results of significant diagnostics from this hospitalization (including imaging, microbiology, ancillary and laboratory) are listed below for reference.     Microbiology: Recent Results (from the past 240 hour(s))  SARS CORONAVIRUS 2 (TAT 6-24 HRS) Nasopharyngeal Nasopharyngeal Swab     Status: None   Collection Time: 01/02/20  2:55 PM   Specimen: Nasopharyngeal Swab  Result Value Ref Range Status   SARS Coronavirus 2 NEGATIVE NEGATIVE Final    Comment: (NOTE) SARS-CoV-2 target nucleic acids are NOT DETECTED. The SARS-CoV-2 RNA is generally detectable in upper and lower respiratory specimens during the acute phase of infection. Negative results do not preclude SARS-CoV-2 infection,  do not rule out co-infections with other pathogens, and should not be used as the sole basis for treatment or other patient management decisions. Negative results must be combined with clinical observations, patient history, and epidemiological information. The expected result is Negative. Fact Sheet for Patients: SugarRoll.be Fact Sheet for Healthcare Providers: https://www.woods-mathews.com/ This test is not yet approved or cleared by the [place] FDA and  has been authorized for detection and/or diagnosis of SARS-CoV-2 by FDA under an Emergency Use Authorization (EUA). This EUA will remain  in effect (meaning this test can be used) for the duration of the COVID-19 declaration under Section 56 4(b)(1) of the Act, 21 U.S.C. section 360bbb-3(b)(1), unless the authorization is terminated or revoked sooner. Performed at [facility], [place] [address] [place], [place] [address]   Aerobic/Anaerobic Culture (surgical/deep wound)     Status: None (Preliminary result)   Collection Time: 01/05/20  1:39 PM   Specimen: PATH Amputaion Arm/Leg; Tissue  Result Value Ref Range Status   Specimen Description   Final    FOOT LEFT Performed at [facility] [address] [place], [place] [address]    Special Requests   Final    NONE Performed at [facility], [place] [address] [place], [place] [address]    Gram Stain   Final    RARE WBC PRESENT, PREDOMINANTLY PMN RARE GRAM POSITIVE COCCI IN CLUSTERS Performed at [facility], [place] [address], [place], [place] [address]    Culture   Final    RARE METHICILLIN RESISTANT STAPHYLOCOCCUS AUREUS NO ANAEROBES ISOLATED; CULTURE IN PROGRESS FOR 5 DAYS    Report Status PENDING  Incomplete   Organism ID, Bacteria METHICILLIN RESISTANT STAPHYLOCOCCUS AUREUS  Final      Susceptibility   Methicillin resistant staphylococcus aureus - MIC*    CIPROFLOXACIN <=0.5  SENSITIVE Sensitive     ERYTHROMYCIN >=8 RESISTANT Resistant     GENTAMICIN <=0.5 SENSITIVE Sensitive     OXACILLIN >=4 RESISTANT Resistant     TETRACYCLINE <=1 SENSITIVE Sensitive     VANCOMYCIN 1 SENSITIVE Sensitive     TRIMETH/SULFA <=10 SENSITIVE Sensitive     CLINDAMYCIN <=0.25 SENSITIVE Sensitive     RIFAMPIN <=0.5 SENSITIVE Sensitive     Inducible Clindamycin NEGATIVE Sensitive     * RARE METHICILLIN RESISTANT STAPHYLOCOCCUS AUREUS  Aerobic/Anaerobic Culture (surgical/deep wound)     Status: None (Preliminary result)   Collection Time: 01/05/20  1:42 PM   Specimen: PATH Bone biopsy; Tissue  Result Value Ref Range Status   Specimen Description   Final    FOOT LEFT BONE CUBOID Performed at [place] [facility] [address] [place], [place] [address]    Special Requests   Final    NONE Performed at [facility], [place] [address] [place], [place] [address]    Gram Stain   Final    RARE WBC PRESENT, PREDOMINANTLY MONONUCLEAR NO ORGANISMS SEEN    Culture   Final    NO GROWTH 3 DAYS NO ANAEROBES ISOLATED; CULTURE IN PROGRESS FOR 5 DAYS Performed at [facility] [address], [place], [place] [address]    Report Status PENDING  Incomplete  Aerobic/Anaerobic Culture (surgical/deep wound)     Status: None (Preliminary result)   Collection Time: 01/05/20  1:48 PM   Specimen: PATH Bone biopsy; Tissue  Result Value Ref Range Status   Specimen Description   Final    FOOT LEFT LATERAL CUNIEFORM Performed at [place] [address], [place], [place] [address]    Special Requests   Final    NONE Performed at [facility], [place] [address], [place], [place] [address]    Gram Stain   Final    RARE WBC PRESENT, PREDOMINANTLY PMN NO ORGANISMS SEEN    Culture   Final    NO GROWTH 3 DAYS NO ANAEROBES ISOLATED; CULTURE IN PROGRESS FOR 5 DAYS Performed at [facility] [address],  [place], [place] [address]    Report Status PENDING  Incomplete  Surgical pcr screen     Status: None   Collection Time: 01/07/20 10:37 AM   Specimen: Nasal Mucosa; Nasal Swab  Result Value Ref Range Status   MRSA, PCR NEGATIVE NEGATIVE Final   Staphylococcus aureus NEGATIVE NEGATIVE Final    Comment: (NOTE) The Xpert SA Assay (FDA approved for NASAL specimens in patients 39 years of age and older), is one component of a comprehensive surveillance program. It is not intended to diagnose infection nor to guide or monitor treatment. Performed at [facility], [place] [address], [place], [place] [address]      Labs: BNP (last 3 results) No results for input(s): BNP in the last 8760 hours. Basic Metabolic Panel: Recent Labs  Lab 01/02/20 1538 01/06/20 0445 01/07/20 0912  NA 128* 130* 131*  K 5.1 5.2* 4.7  CL 95* 99 100  CO2 23 19* 21*  GLUCOSE 256* 204* 113*  BUN 31* 40* 35*  CREATININE 2.33* 2.26* 1.84*  CALCIUM 9.3 9.3 9.3   Liver Function Tests: No results for input(s): AST, ALT, ALKPHOS, BILITOT, PROT, ALBUMIN in the last 168 hours. No results for input(s): LIPASE, AMYLASE in the last 168 hours. No results for input(s): AMMONIA in the last 168 hours. CBC: Recent Labs  Lab 01/02/20 1538 01/06/20 0445 01/07/20 0912 01/08/20 0839  WBC 10.1 15.6* 8.3 13.9*  NEUTROABS  --   --  4.5 10.6*  HGB 10.0* 8.9* 8.1* 8.2*  HCT 33.8* 29.9* 26.7* 27.8*  MCV 75.3* 74.9* 74.6* 74.7*  PLT 691* 632* 582* 650*   Cardiac Enzymes: No results for input(s): CKTOTAL, CKMB, CKMBINDEX, TROPONINI in the last 168 hours. BNP: Invalid input(s): POCBNP CBG: Recent Labs  Lab 01/07/20 1341 01/07/20 1524 01/07/20 1641 01/07/20 2248 01/08/20 0732  GLUCAP 158* 123* 93 284* 160*   D-Dimer No results for input(s): DDIMER in the last 72 hours. Hgb A1c No results for input(s): HGBA1C in the last 72 hours. Lipid Profile No results for input(s): CHOL, HDL, LDLCALC, TRIG,  CHOLHDL, LDLDIRECT in the last 72 hours. Thyroid function studies No results for input(s): TSH, T4TOTAL, T3FREE, THYROIDAB in the last 72 hours.  Invalid input(s): FREET3 Anemia work up Recent Labs    01/07/20 0912  FERRITIN 257  TIBC 203*  IRON 86   Urinalysis    Component Value Date/Time   COLORURINE YELLOW 11/18/2019 1051   APPEARANCEUR CLEAR 11/18/2019 1051   LABSPEC 1.012 11/18/2019 1051   PHURINE 5.0 11/18/2019 1051   GLUCOSEU NEGATIVE 11/18/2019 1051   HGBUR NEGATIVE 11/18/2019 1051   BILIRUBINUR NEGATIVE 11/18/2019 1051   KETONESUR NEGATIVE 11/18/2019 1051   PROTEINUR NEGATIVE 11/18/2019 1051   UROBILINOGEN >8.0 (H) 03/06/2010 2057   NITRITE NEGATIVE 11/18/2019 1051   LEUKOCYTESUR NEGATIVE 11/18/2019 1051   Sepsis Labs Invalid input(s): PROCALCITONIN,  WBC,  LACTICIDVEN Microbiology Recent Results (from the past 240 hour(s))  SARS CORONAVIRUS 2 (TAT 6-24 HRS) Nasopharyngeal Nasopharyngeal Swab     Status: None   Collection Time: 01/02/20  2:55 PM   Specimen: Nasopharyngeal Swab  Result Value Ref Range Status   SARS Coronavirus 2 NEGATIVE NEGATIVE Final    Comment: (NOTE) SARS-CoV-2 target nucleic acids are NOT DETECTED. The SARS-CoV-2 RNA is generally detectable in upper and lower respiratory specimens during the acute phase of infection. Negative results do not preclude SARS-CoV-2 infection, do not rule out co-infections with other pathogens, and should not be used as the sole basis for treatment or other patient management decisions. Negative results must be combined with clinical observations, patient history, and epidemiological information. The expected result is Negative. Fact Sheet for Patients: SugarRoll.be Fact Sheet for Healthcare Providers:  https://www.woods-mathews.com/ This test is not yet approved or cleared by the [place] and  has been authorized for detection and/or diagnosis of SARS-CoV-2  by FDA under an Emergency Use Authorization (EUA). This EUA will remain  in effect (meaning this test can be used) for the duration of the COVID-19 declaration under Section 56 4(b)(1) of the Act, 21 U.S.C. section 360bbb-3(b)(1), unless the authorization is terminated or revoked sooner. Performed at [facility], [place] [address], [place], [place] [address]   Aerobic/Anaerobic Culture (surgical/deep wound)     Status: None (Preliminary result)   Collection Time: 01/05/20  1:39 PM   Specimen: PATH Amputaion Arm/Leg; Tissue  Result Value Ref Range Status   Specimen Description   Final    FOOT LEFT Performed at [facility] [address] [place], [place] [address]    Special Requests   Final    NONE Performed at [facility], [place] [address] [place], [place] [address]    Gram Stain   Final    RARE WBC PRESENT, PREDOMINANTLY PMN RARE GRAM POSITIVE COCCI IN CLUSTERS Performed at [facility], [place] [address] [place], [place] [address]    Culture   Final    RARE METHICILLIN RESISTANT STAPHYLOCOCCUS AUREUS NO ANAEROBES ISOLATED; CULTURE IN PROGRESS FOR 5 DAYS    Report Status PENDING  Incomplete   Organism ID, Bacteria METHICILLIN RESISTANT STAPHYLOCOCCUS AUREUS  Final      Susceptibility   Methicillin resistant staphylococcus aureus - MIC*    CIPROFLOXACIN <=0.5 SENSITIVE Sensitive     ERYTHROMYCIN >=8 RESISTANT Resistant     GENTAMICIN <=0.5 SENSITIVE Sensitive     OXACILLIN >=4 RESISTANT Resistant     TETRACYCLINE <=1 SENSITIVE Sensitive     VANCOMYCIN 1 SENSITIVE Sensitive     TRIMETH/SULFA <=10 SENSITIVE Sensitive     CLINDAMYCIN <=0.25 SENSITIVE Sensitive     RIFAMPIN <=0.5 SENSITIVE Sensitive     Inducible Clindamycin NEGATIVE Sensitive     * RARE METHICILLIN RESISTANT STAPHYLOCOCCUS AUREUS  Aerobic/Anaerobic Culture (surgical/deep wound)     Status: None (Preliminary result)   Collection Time: 01/05/20  1:42 PM    Specimen: PATH Bone biopsy; Tissue  Result Value Ref Range Status   Specimen Description   Final    FOOT LEFT BONE CUBOID Performed at [place] [facility] [address] [place], [place] [address]    Special Requests   Final    NONE Performed at [facility], [place] [address], [place], [place] [address]    Gram Stain   Final    RARE WBC PRESENT, PREDOMINANTLY MONONUCLEAR NO ORGANISMS SEEN    Culture   Final    NO GROWTH 3 DAYS NO ANAEROBES ISOLATED; CULTURE IN PROGRESS FOR 5 DAYS Performed at [facility] [address] [place], [place] [address]    Report Status PENDING  Incomplete  Aerobic/Anaerobic Culture (surgical/deep wound)     Status: None (Preliminary result)   Collection Time: 01/05/20  1:48 PM   Specimen: PATH Bone biopsy; Tissue  Result Value Ref Range Status   Specimen Description   Final    FOOT LEFT LATERAL CUNIEFORM Performed at [facility] [address], [place], [place] [address]    Special Requests   Final    NONE Performed at [facility], [place] [address] [place],  [address]    Gram Stain   Final    RARE  WBC PRESENT, PREDOMINANTLY PMN NO ORGANISMS SEEN    Culture   Final    NO GROWTH 3 DAYS NO ANAEROBES ISOLATED; CULTURE IN PROGRESS FOR 5 DAYS Performed at [facility] [address] [place], [place] [address]    Report Status PENDING  Incomplete  Surgical pcr screen     Status: None   Collection Time: 01/07/20 10:37 AM   Specimen: Nasal Mucosa; Nasal Swab  Result Value Ref Range Status   MRSA, PCR NEGATIVE NEGATIVE Final   Staphylococcus aureus NEGATIVE NEGATIVE Final    Comment: (NOTE) The Xpert SA Assay (FDA approved for NASAL specimens in patients 61 years of age and older), is one component of a comprehensive surveillance program. It is not intended to diagnose infection nor to guide or monitor treatment. Performed at [facility], [place] [address], [place], [place] [address]     Please note: You were cared for by a hospitalist during your hospital stay. Once you are discharged, your primary care physician will handle any further medical issues. Please note that NO REFILLS for any discharge medications will be authorized once you are discharged, as it is imperative that you return to your primary care physician (or establish a relationship with a primary care physician if you do not have one) for your post hospital discharge needs so that they can reassess your need for medications and monitor your lab values.    Time coordinating discharge: 40 minutes  SIGNED:   Shelly Coss, MD  [facility] 01/08/2020, 11:12 AM Pager [phone]  If 7PM-7AM, please contact night-coverage [email or web address] Password [email or web address]

## 2020-01-08 NOTE — Progress Notes (Signed)
Received call from RN this morning about potential discharge. He was feeling well and afebrile. WBC was elevated but I think this is in response to surgery as clinically it did not appear infected during surgery. His pain was controlled. His lost IV access overnight and refused further IV. Therefore not able to get IV antibiotics as needed. Will discharge home with oral antibiotics. He has an appointment already made.

## 2020-01-08 NOTE — Progress Notes (Signed)
Pt IV infiltrated, RN removed. Pt refusing to let IV team place a new IV access. Pt educated on importance of IV antibiotics. Pt verbalizes understanding but still refuses. On call MD notified via page. Pt resting comfortably in bed.

## 2020-01-08 NOTE — Progress Notes (Signed)
Attempt x1 for PIV access without success. Pt refused further attempt. Explained order for IV medications; pt still declines PIV at this time. RN notified.

## 2020-01-10 ENCOUNTER — Encounter: Payer: Self-pay | Admitting: Podiatry

## 2020-01-10 LAB — AEROBIC/ANAEROBIC CULTURE W GRAM STAIN (SURGICAL/DEEP WOUND)
Culture: NO GROWTH
Culture: NO GROWTH

## 2020-01-10 NOTE — Progress Notes (Signed)
I called the patient to see how he was doing. He is doing well and no significant pain. Denies any fevers, chills or other systemic symptoms. No swelling, warmth, pain to the leg. Incisional wound VAC still in place. Follow up as scheduled or sooner if any issues arise. No further questions/concnerns.

## 2020-01-13 ENCOUNTER — Other Ambulatory Visit: Payer: Self-pay

## 2020-01-13 ENCOUNTER — Ambulatory Visit (INDEPENDENT_AMBULATORY_CARE_PROVIDER_SITE_OTHER): Payer: Medicare HMO | Admitting: Podiatry

## 2020-01-13 ENCOUNTER — Encounter: Payer: Medicare HMO | Admitting: Podiatry

## 2020-01-13 DIAGNOSIS — L97522 Non-pressure chronic ulcer of other part of left foot with fat layer exposed: Secondary | ICD-10-CM

## 2020-01-13 DIAGNOSIS — M86072 Acute hematogenous osteomyelitis, left ankle and foot: Secondary | ICD-10-CM

## 2020-01-13 MED ORDER — DOXYCYCLINE MONOHYDRATE 100 MG PO TABS: 100.0000 mg | ORAL_TABLET | Freq: Two times a day (BID) | ORAL | 0 refills | Status: DC

## 2020-01-13 NOTE — Progress Notes (Signed)
Subjective: Anthony Flowers is a 59 y.o. is seen today in office s/p Lisfranc disarticulation, wound debridement with delayed primary closure preformed on 01/07/2020.  States he is doing well and his pain is controlled.  He presents today for follow-up evaluation to remove the incisional wound VAC.  Is been nonweightbearing but as possible using the offloading shoe as well as the knee scooter.  He is still on doxycycline.  Denies any systemic complaints such as fevers, chills, nausea, vomiting. No calf pain, chest pain, shortness of breath.   Objective: General: No acute distress, AAOx3  DP/PT pulses palpable 2/4, CRT < 3 sec to all digits.  LEFT foot: Prevena wound VAC intact.  There was some bloody drainage on the bandage which he states started just today.  Incision is well coapted without any evidence of dehiscence with sutures, staples intact.  There is macerated tissue on the inferior aspect. There is no surrounding erythema, ascending cellulitis, fluctuance, crepitus, malodor, drainage/purulence. There is mild edema around the surgical site. There is no significant pain along the surgical site.  No other areas of tenderness to bilateral lower extremities.  No other open lesions or pre-ulcerative lesions.  No pain with calf compression, swelling, warmth, erythema.   Assessment and Plan:  Status post left foot Lisfranc disarticulation, doing well with no complications   -Treatment options discussed including all alternatives, risks, and complications -I reviewed the Prevena wound VAC today.  Incision remains well coapted with sutures and staples intact.  Macerated tissue present.  Betadine was painted over the incision followed by a dry sterile dressing.  Ordered wound care supplies from [facility]. -Continue to elevate.  Recommend nonweightbearing in cam boot. -Refill doxycycline as a precaution. -Pain medication as needed. -Monitor for any clinical signs or symptoms of infection and DVT/PE and  directed to call the office immediately should any occur or go to the ER. -Follow-up as scheduled or sooner if any problems arise. In the meantime, encouraged to call the office with any questions, concerns, change in symptoms.   Celesta Gentile, DPM

## 2020-01-20 ENCOUNTER — Other Ambulatory Visit: Payer: Self-pay

## 2020-01-20 ENCOUNTER — Ambulatory Visit (INDEPENDENT_AMBULATORY_CARE_PROVIDER_SITE_OTHER): Payer: Medicare HMO | Admitting: Podiatry

## 2020-01-20 ENCOUNTER — Encounter: Payer: Self-pay | Admitting: Podiatry

## 2020-01-20 DIAGNOSIS — M86072 Acute hematogenous osteomyelitis, left ankle and foot: Secondary | ICD-10-CM

## 2020-01-20 DIAGNOSIS — L97522 Non-pressure chronic ulcer of other part of left foot with fat layer exposed: Secondary | ICD-10-CM

## 2020-01-20 DIAGNOSIS — T8130XA Disruption of wound, unspecified, initial encounter: Secondary | ICD-10-CM

## 2020-01-21 MED ORDER — LINEZOLID 600 MG PO TABS: 600.0000 mg | ORAL_TABLET | Freq: Two times a day (BID) | ORAL | 0 refills | Status: DC

## 2020-01-21 NOTE — Progress Notes (Signed)
Subjective: Anthony Flowers is a 59 y.o. is seen today in office s/p Lisfranc disarticulation, wound debridement with delayed primary closure preformed on 01/07/2020.  He states that he thinks that some of the staples may have come loose.  He does note starting some drainage yesterday has been bloody drainage on the bandage.  Denies any increase in swelling or redness or any warmth of the foot or leg he is having no pain to the foot.  He has noticed a new dark spot on the bottom of his right foot submetatarsal 1 which started after last saw him but has not opened up yet. Denies any systemic complaints such as fevers, chills, nausea, vomiting. No calf pain, chest pain, shortness of breath.   Objective: General: No acute distress, AAOx3  DP/PT pulses palpable 2/4, CRT < 3 sec to all digits.  LEFT foot: Medial one half of the wound is intact however the lateral aspect has dehisced and measures approximately 8 x 1 cm x 0.4 cm.  Is no probing to bone, undermining or tunneling.  There is mild edema there is no erythema or warmth there is no fluctuation crepitation.  There is no warmth or swelling to the leg.  There is no pain.  No malodor. RIGHT foot: Submetatarsal 1 hyperkeratotic lesion.  No ulceration identified but no drainage or pus.  No other open lesions identified bilaterally. No other areas of tenderness to bilateral lower extremities.  No other open lesions or pre-ulcerative lesions.  No pain with calf compression, swelling, warmth, erythema.   01/05/2020 Specimen Description FOOT LEFT  Performed at [facility], [place] [address] [place], [place] [address]   Special Requests NONE  Performed at [facility], [place] [address], [place], [place] [address]   Gram Stain RARE WBC PRESENT, PREDOMINANTLY PMN  RARE GRAM POSITIVE COCCI IN CLUSTERS   Culture RARE METHICILLIN RESISTANT STAPHYLOCOCCUS AUREUS  NO ANAEROBES ISOLATED  Performed at [facility],  [place] [address] [place], [place] [address]   Report Status 01/10/2020 FINAL   Organism ID, Bacteria METHICILLIN RESISTANT STAPHYLOCOCCUS AUREUS   Resulting Agency [facility]  Susceptibility   Methicillin resistant staphylococcus aureus    MIC    CIPROFLOXACIN <=0.5 SENSI... Sensitive    CLINDAMYCIN <=0.25 SENS... Sensitive    ERYTHROMYCIN >=8 RESISTANT  Resistant    GENTAMICIN <=0.5 SENSI... Sensitive    Inducible Clindamycin NEGATIVE  Sensitive    OXACILLIN >=4 RESISTANT  Resistant    RIFAMPIN <=0.5 SENSI... Sensitive    TETRACYCLINE <=1 SENSITIVE  Sensitive    TRIMETH/SULFA <=10 SENSIT... Sensitive    VANCOMYCIN 1 SENSITIVE  Sensitive      Specimen Description FOOT LEFT BONE CUBOID  Performed at [facility] [address] [place], [place] [address]   Special Requests NONE  Performed at [facility], [place] [address], [place], [place] [address]   Gram Stain RARE WBC PRESENT, PREDOMINANTLY MONONUCLEAR  NO ORGANISMS SEEN   Culture No growth aerobically or anaerobically.  Performed at [facility], [place] [address] [place],  [address]   Report Status 01/10/2020 FINAL    Specimen Description FOOT LEFT LATERAL CUNIEFORM  Performed at [facility] [address], [place],  [address]   Special Requests NONE  Performed at [facility], [place] [address] [place], [place] [address]   Gram Stain RARE WBC PRESENT, PREDOMINANTLY PMN  NO ORGANISMS SEEN   Culture No  growth aerobically or anaerobically.  Performed at [facility], [place] [address], [place], [place] [address]   Report Status 01/10/2020 FINAL       Assessment and Plan:  Status post left foot Lisfranc disarticulation, dehiscence; preulcerative area right foot  -Treatment options discussed including all alternatives, risks, and complications -I remove the staples on the left side there were no longer helping.  I cleaned the  wound and applied some amount of Betadine.  I would order Maxorb Extra Ag Silver dressing to apply daily through [facility].  Encourage elevation continue cam boot and stay off the foot is much as possible.  Also he has palpable pulses on exam and he is healed other amputations previously however given the delayed healing I would order arterial studies.  Also discussed the possible referral to the wound care center.  Return in about 1 week (around 01/27/2020).  Trula Slade DPM   *Given he is finishing antibiotics tomorrow the wound culture originally did show MRSA but the bone cultures were negative.  Given the wound opening I would continue antibiotics tomorrow start Zyvox.  Clinically upon repeat debridement the wound did not appear to be infected.

## 2020-01-22 ENCOUNTER — Telehealth: Payer: Self-pay | Admitting: *Deleted

## 2020-01-22 DIAGNOSIS — T8130XA Disruption of wound, unspecified, initial encounter: Secondary | ICD-10-CM

## 2020-01-22 DIAGNOSIS — M86072 Acute hematogenous osteomyelitis, left ankle and foot: Secondary | ICD-10-CM

## 2020-01-22 DIAGNOSIS — L97522 Non-pressure chronic ulcer of other part of left foot with fat layer exposed: Secondary | ICD-10-CM

## 2020-01-22 NOTE — Telephone Encounter (Signed)
-----   Message from Trula Slade, DPM sent at 01/22/2020  8:25 AM EDT ----- Can you please order arterial studies for him? Sorry if this is a duplicate. Thanks.

## 2020-01-23 NOTE — Telephone Encounter (Signed)
[name], " THIS [place] MEDICAID MEMBER DOES NOT REQUIRE PRIOR AUTHORIZATION FOR OUTPATIENT RADIOLOGY [place] AT THIS TIME." Faxed to [facility].

## 2020-01-27 ENCOUNTER — Other Ambulatory Visit: Payer: Self-pay

## 2020-01-27 ENCOUNTER — Other Ambulatory Visit: Payer: Self-pay | Admitting: Podiatry

## 2020-01-27 ENCOUNTER — Ambulatory Visit (INDEPENDENT_AMBULATORY_CARE_PROVIDER_SITE_OTHER): Payer: Medicare HMO | Admitting: Podiatry

## 2020-01-27 ENCOUNTER — Encounter: Payer: Medicare HMO | Admitting: Podiatry

## 2020-01-27 ENCOUNTER — Telehealth: Payer: Self-pay | Admitting: *Deleted

## 2020-01-27 DIAGNOSIS — L97522 Non-pressure chronic ulcer of other part of left foot with fat layer exposed: Secondary | ICD-10-CM

## 2020-01-27 DIAGNOSIS — M86072 Acute hematogenous osteomyelitis, left ankle and foot: Secondary | ICD-10-CM

## 2020-01-27 DIAGNOSIS — T8130XA Disruption of wound, unspecified, initial encounter: Secondary | ICD-10-CM

## 2020-01-27 MED ORDER — LINEZOLID 600 MG PO TABS: 600.0000 mg | ORAL_TABLET | Freq: Two times a day (BID) | ORAL | 0 refills | Status: DC

## 2020-01-27 MED ORDER — SANTYL 250 UNIT/GM EX OINT: 1.0000 | TOPICAL_OINTMENT | Freq: Every day | CUTANEOUS | 0 refills | Status: DC

## 2020-01-27 NOTE — Progress Notes (Signed)
Subjective: Anthony Flowers is a 59 y.o. is seen today in office s/p Lisfranc disarticulation, wound debridement with delayed primary closure preformed on 01/07/2020.  He reports he has been on his feet a lot recently as he has had to move out of his house.  He has no significant pain to the foot.  He is scheduled for circulation test but not for couple more weeks. He denies any fevers, chills, nausea, vomiting.  No calf pain, chest pain and shortness of breath.   He is on Zyvox currently.  Denies any systemic complaints such as fevers, chills, nausea, vomiting. No calf pain, chest pain, shortness of breath.   Objective: General: No acute distress, AAOx3  DP/PT pulses palpable, CRT < 3 sec to all digits.  LEFT foot: Medial one half of the wound is intact however the lateral aspect has dehisced and measures approximately 8 x 1.5 cm x 2 cm.  The wound base is fibrogranular small not a clear drainage is expressed.  There is no probing to bone although it is close.  There is mild edema to the foot there is no erythema or warmth.  There is no ascending cellulitis. No other open lesions or pre-ulcerative lesions.  No pain with calf compression, swelling, warmth, erythema.   01/05/2020 Specimen Description FOOT LEFT  Performed at [facility], [place] [address] [place], [place] [address]   Special Requests NONE  Performed at [facility], [place] [address] [place], [place] [address]   Gram Stain RARE WBC PRESENT, PREDOMINANTLY PMN  RARE GRAM POSITIVE COCCI IN CLUSTERS   Culture RARE METHICILLIN RESISTANT STAPHYLOCOCCUS AUREUS  NO ANAEROBES ISOLATED  Performed at [facility], [place] [address], [place], [place] [address]   Report Status 01/10/2020 FINAL   Organism ID, Bacteria METHICILLIN RESISTANT STAPHYLOCOCCUS AUREUS   Resulting Agency [facility]  Susceptibility   Methicillin resistant staphylococcus aureus    MIC    CIPROFLOXACIN <=0.5 SENSI...  Sensitive    CLINDAMYCIN <=0.25 SENS... Sensitive    ERYTHROMYCIN >=8 RESISTANT  Resistant    GENTAMICIN <=0.5 SENSI... Sensitive    Inducible Clindamycin NEGATIVE  Sensitive    OXACILLIN >=4 RESISTANT  Resistant    RIFAMPIN <=0.5 SENSI... Sensitive    TETRACYCLINE <=1 SENSITIVE  Sensitive    TRIMETH/SULFA <=10 SENSIT... Sensitive    VANCOMYCIN 1 SENSITIVE  Sensitive      Specimen Description FOOT LEFT BONE CUBOID  Performed at [facility] [address] [place], [place] [address]   Special Requests NONE  Performed at [facility], [place] [address], [place], [place] [address]   Gram Stain RARE WBC PRESENT, PREDOMINANTLY MONONUCLEAR  NO ORGANISMS SEEN   Culture No growth aerobically or anaerobically.  Performed at [facility], [place] [address] [place], [place] [address]   Report Status 01/10/2020 FINAL    Specimen Description FOOT LEFT LATERAL CUNIEFORM  Performed at [place] [address], [place], [place] [address]   Special Requests NONE  Performed at [facility], [place] [address], [place], [place] [address]   Gram Stain RARE WBC PRESENT, PREDOMINANTLY PMN  NO ORGANISMS SEEN   Culture No growth aerobically or anaerobically.  Performed at [facility], [place] [address], [place], [place] [address]   Report Status 01/10/2020 FINAL       Assessment and Plan:  Status post left foot Lisfranc disarticulation, dehiscence; preulcerative area right foot  -  Treatment options discussed including all alternatives, risks, and complications -The wound is worsened.  He has been on his feet quite a bit recently as he is on the right of his house.  Is also very stressed because he is not sure where he is going to live.  I was told to help him with different resources he states that he will "figure it out".  -We will switch to Santyl dressing changes which I ordered today given the fibrotic tissue.  Also  continue Zyvox for now.  Although he has palpable pulses I am ordering arterial studies.  We can call to see if we get these done quicker. -He needs to stay off his foot and stay nonweightbearing.  He presents the office using the knee scooter but I see him walking around a lot without the use of this.  Encourage elevation. -Also refer to wound care center pending arterial studies next week. -Discussed that he is at very high risk of below-knee amputation which he is aware of.  Return in about 1 week   Trula Slade DPM

## 2020-01-27 NOTE — Telephone Encounter (Signed)
I spoke with [facility] - Altha Harm states I would need to speak with Daphane Shepherd to discuss earlier scheduling, and their phone system is problematic currently, but I could Secure Chat through [facility]. I sent Secure Chat message to [facility] requesting ABI with TBI appt earlier than 02/12/2020, due to change of status of foot ulcer.

## 2020-01-27 NOTE — Telephone Encounter (Signed)
Anthony Flowers - [facility] states pt has been moved to 02/04/2020.

## 2020-02-03 ENCOUNTER — Encounter: Payer: Medicare HMO | Admitting: Podiatry

## 2020-02-04 ENCOUNTER — Ambulatory Visit (HOSPITAL_COMMUNITY)
Admission: RE | Admit: 2020-02-04 | Payer: Medicare HMO | Source: Ambulatory Visit | Attending: Podiatry | Admitting: Podiatry

## 2020-02-06 ENCOUNTER — Telehealth: Payer: Self-pay | Admitting: *Deleted

## 2020-02-06 NOTE — Telephone Encounter (Signed)
Called and left a message for the patient to call me back-I was calling to check on the patient to see how he was doing. Anthony Flowers

## 2020-02-12 ENCOUNTER — Encounter (HOSPITAL_COMMUNITY): Payer: Medicare HMO

## 2020-02-14 ENCOUNTER — Telehealth: Payer: Self-pay | Admitting: *Deleted

## 2020-02-14 NOTE — Telephone Encounter (Signed)
Called and patient stated that they are in [place], [place] and will be finding a medical doctor and a podiatrist and I stated to call the [place] office if any concerns or questions. Lattie Haw

## 2020-03-02 ENCOUNTER — Telehealth: Payer: Self-pay | Admitting: Podiatry

## 2020-03-02 NOTE — Telephone Encounter (Signed)
I attempted to call patient back, no answer. Left VM to call back prior to sending medications.   Cranford Mon, CMA had talked to him a few weeks ago and he had moved out of state which I was not aware of and he did not let me know. We had tired to reach him. He has a foot doctor apparently I was told but if he needed assistance to let me know and I will send records.

## 2020-03-02 NOTE — Telephone Encounter (Signed)
Error

## 2020-03-02 NOTE — Telephone Encounter (Addendum)
Pt states [place] is [place] and it is more trouble than he had anticipated to find a foot doctor, he has to get a PCP 1st, and needs a refill of the antibiotic because his foot is still open and a refill of the pain medication, sent to the [facility], [place] on [place].

## 2020-03-02 NOTE — Telephone Encounter (Signed)
Pt called stating he wanted Dr.Wagoner to call him he had questions for him unspecified please assist

## 2020-03-04 NOTE — Telephone Encounter (Signed)
Pt called and was returning call and would like a call back please assist

## 2020-03-05 NOTE — Telephone Encounter (Signed)
I called the patient to discuss his situation. He has moved to [facility] due to personal issues. He had a doctor but unfortunately he cannot find a doctor that takes his insurance and it is in the process of getting it switched over. He has gone to the ER in [place] and he wanted his foot looked at. He did not want to be admitted and a BKA was recommended. He apparently left the doctor AMA. He went to see his foot doctor that he had in [place] that discussed the options. He states that the doctor wants to do a BKA. They also discussed long term antibiotics, wound VAC as an alternatives. The patient is leaning toward BKA but is awaiting his insurance to be changed and he is not seeing a doctor right now. He called the doctor he saw there and will not send the antibiotics to the pharmacy as he says he cannot go back to them until the insurance is changed. It seems that the foot is stable currently but is asking for an antibiotic. I will send the antibiotics. He was asking for pain medication but since I have not sen him I cannot do that.   I told the patient that if he needed anything from me to let me know.

## 2020-06-29 ENCOUNTER — Encounter: Payer: Self-pay | Admitting: Podiatry

## 2020-09-08 IMAGING — DX DG FOOT COMPLETE 3+V*L*
3 series · 3 of 3 positions shown · non-contrast
Comparison: Plain films of the left foot 12/30/2019 and MRI
01/03/2020

CLINICAL DATA: Status post revision transmetatarsal amputation
today.

EXAM:
LEFT FOOT - COMPLETE 3+ VIEW

[foot ap]
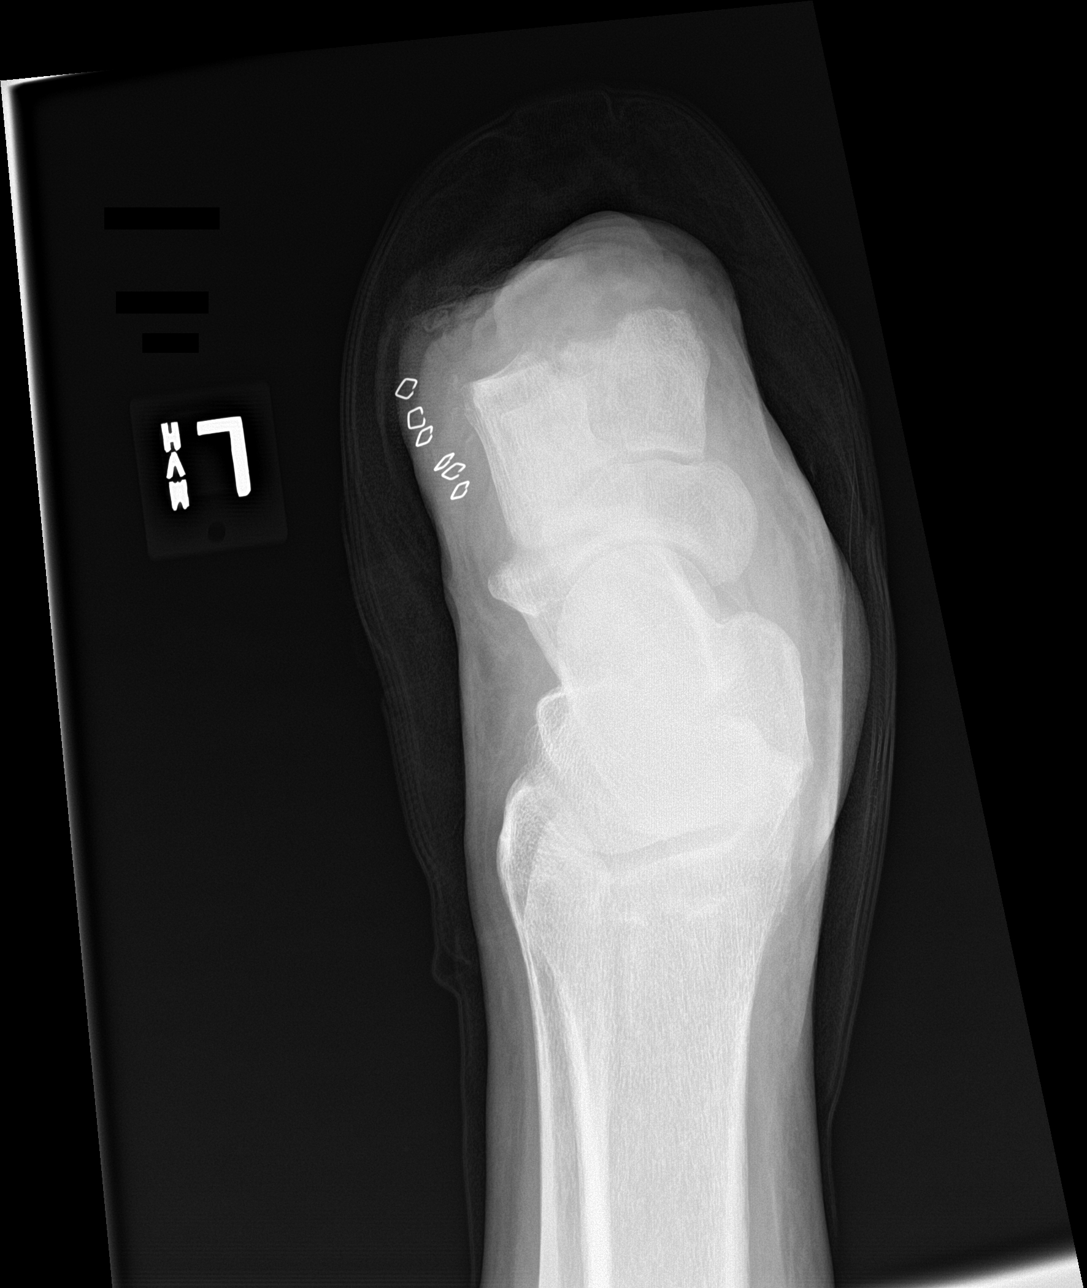

[foot obl]
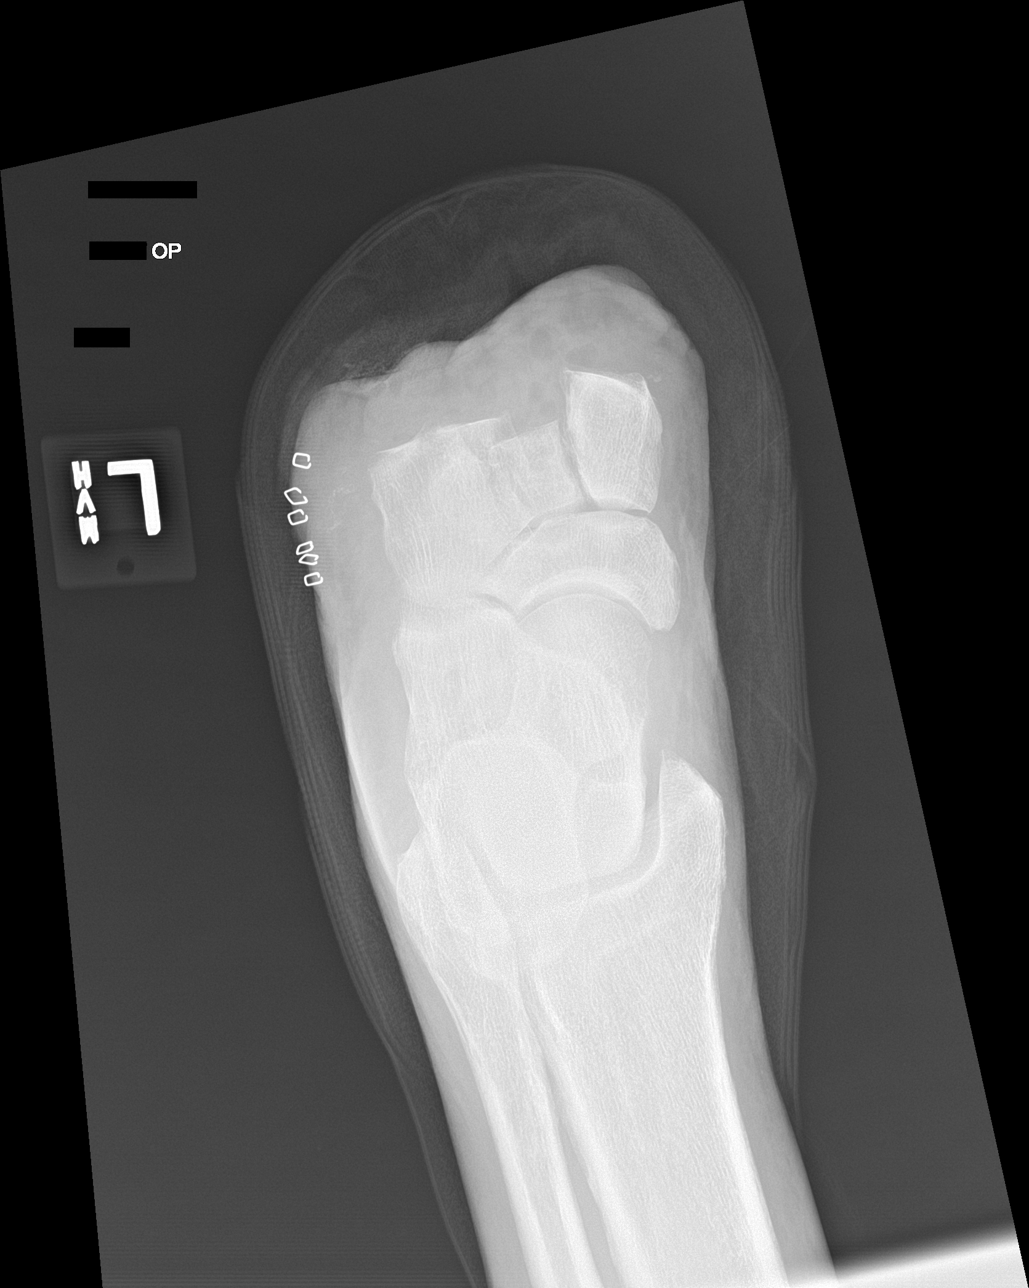

[foot lat]
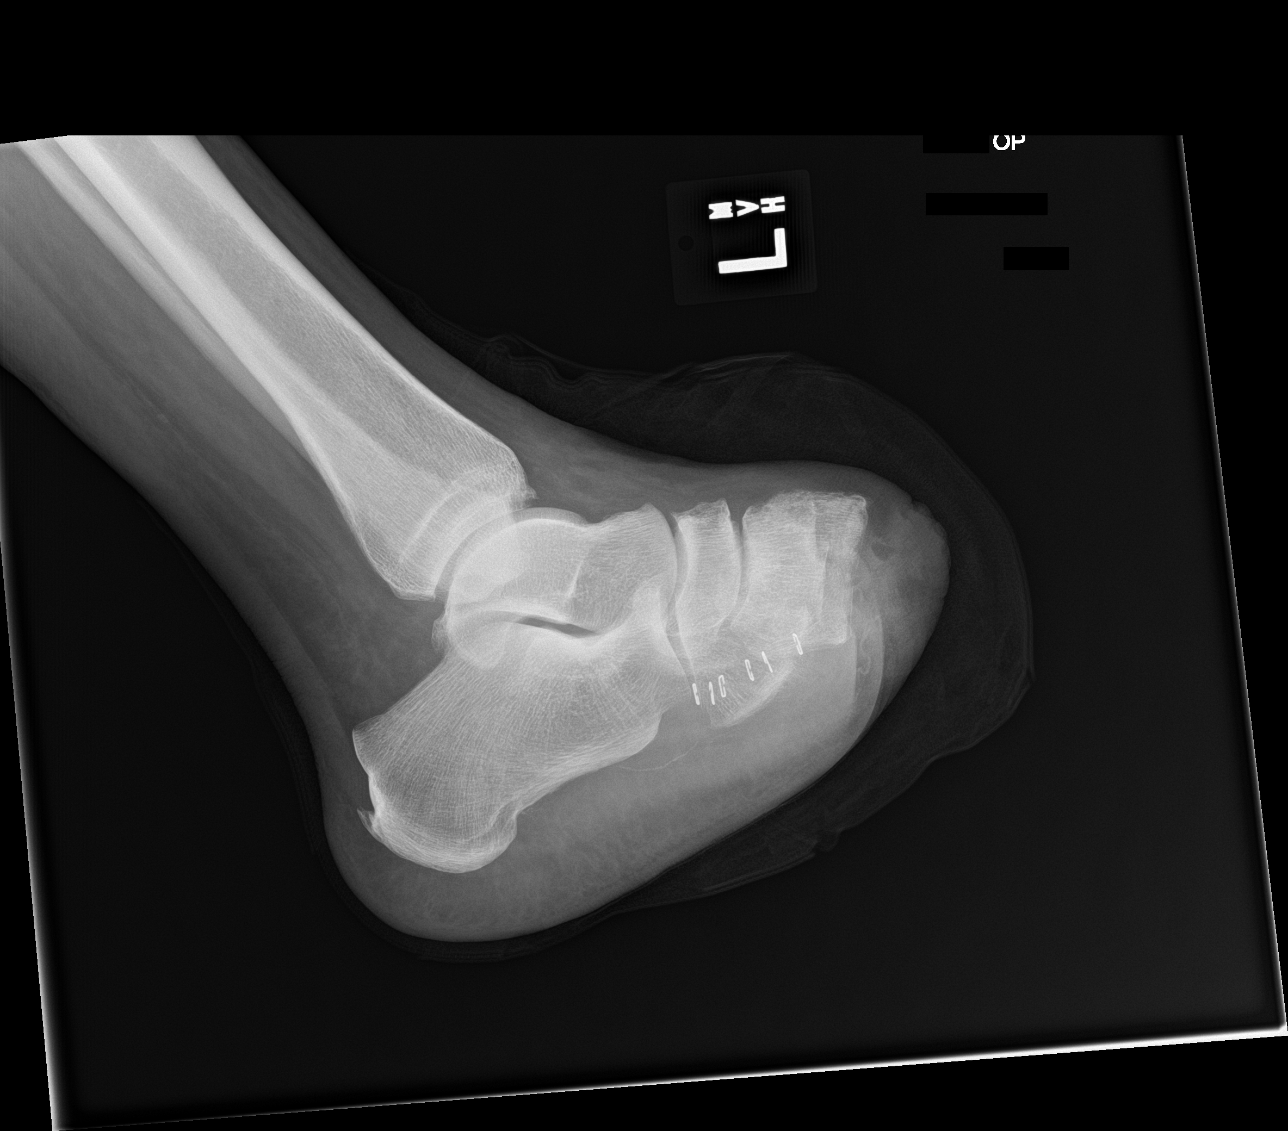

[3 of 3 positions shown; findings below may reference images not displayed]

FINDINGS: Remnants of the metatarsals have been resected. Gas in the soft
tissues and surgical staples noted. No unexpected radiopaque foreign
body or acute abnormality.
IMPRESSION: Status post resection of the metatarsal remnants.  No acute finding.

## 2021-03-21 DEATH — deceased

## 2024-06-28 DIAGNOSIS — Z113 Encounter for screening for infections with a predominantly sexual mode of transmission: Secondary | ICD-10-CM | POA: Diagnosis not present

## 2024-06-28 DIAGNOSIS — E1169 Type 2 diabetes mellitus with other specified complication: Secondary | ICD-10-CM | POA: Diagnosis not present

## 2024-06-28 DIAGNOSIS — E119 Type 2 diabetes mellitus without complications: Secondary | ICD-10-CM | POA: Diagnosis not present

## 2024-06-28 DIAGNOSIS — E6609 Other obesity due to excess calories: Secondary | ICD-10-CM | POA: Diagnosis not present

## 2024-06-28 DIAGNOSIS — I10 Essential (primary) hypertension: Secondary | ICD-10-CM | POA: Diagnosis not present

## 2024-06-28 DIAGNOSIS — Z0000 Encounter for general adult medical examination without abnormal findings: Secondary | ICD-10-CM | POA: Diagnosis not present

## 2024-06-28 DIAGNOSIS — Z125 Encounter for screening for malignant neoplasm of prostate: Secondary | ICD-10-CM | POA: Diagnosis not present

## 2024-06-28 DIAGNOSIS — Z1159 Encounter for screening for other viral diseases: Secondary | ICD-10-CM | POA: Diagnosis not present

## 2024-06-28 DIAGNOSIS — Z794 Long term (current) use of insulin: Secondary | ICD-10-CM | POA: Diagnosis not present

## 2024-06-28 DIAGNOSIS — E559 Vitamin D deficiency, unspecified: Secondary | ICD-10-CM | POA: Diagnosis not present

## 2024-06-28 DIAGNOSIS — E782 Mixed hyperlipidemia: Secondary | ICD-10-CM | POA: Diagnosis not present

## 2024-06-28 DIAGNOSIS — M129 Arthropathy, unspecified: Secondary | ICD-10-CM | POA: Diagnosis not present

## 2024-06-30 DIAGNOSIS — M129 Arthropathy, unspecified: Secondary | ICD-10-CM | POA: Diagnosis not present

## 2024-06-30 DIAGNOSIS — E7800 Pure hypercholesterolemia, unspecified: Secondary | ICD-10-CM | POA: Diagnosis not present

## 2024-06-30 DIAGNOSIS — E559 Vitamin D deficiency, unspecified: Secondary | ICD-10-CM | POA: Diagnosis not present

## 2024-07-03 ENCOUNTER — Ambulatory Visit (INDEPENDENT_AMBULATORY_CARE_PROVIDER_SITE_OTHER)

## 2024-07-03 ENCOUNTER — Ambulatory Visit: Admitting: Podiatry

## 2024-07-03 ENCOUNTER — Encounter: Payer: Self-pay | Admitting: Podiatry

## 2024-07-03 DIAGNOSIS — L97522 Non-pressure chronic ulcer of other part of left foot with fat layer exposed: Secondary | ICD-10-CM | POA: Diagnosis not present

## 2024-07-03 DIAGNOSIS — L03116 Cellulitis of left lower limb: Secondary | ICD-10-CM

## 2024-07-03 MED ORDER — LINEZOLID 600 MG PO TABS: 600.0000 mg | ORAL_TABLET | Freq: Two times a day (BID) | ORAL | 0 refills | Status: DC
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
## 2024-07-03 NOTE — Patient Instructions (Signed)
 Monitor for any signs/symptoms of infection. Call the office immediately if any occur or go directly to the emergency room. Call with any questions/concerns.

## 2024-07-04 ENCOUNTER — Encounter: Payer: Self-pay | Admitting: Podiatry

## 2024-07-08 ENCOUNTER — Ambulatory Visit
Admission: RE | Admit: 2024-07-08 | Discharge: 2024-07-08 | Disposition: A | Discharge status: Home / Self Care | Source: Ambulatory Visit | Attending: Podiatry | Admitting: Podiatry

## 2024-07-08 ENCOUNTER — Ambulatory Visit: Payer: Self-pay | Admitting: Podiatry

## 2024-07-08 DIAGNOSIS — L97522 Non-pressure chronic ulcer of other part of left foot with fat layer exposed: Secondary | ICD-10-CM

## 2024-07-08 DIAGNOSIS — L03116 Cellulitis of left lower limb: Secondary | ICD-10-CM

## 2024-07-08 DIAGNOSIS — M86172 Other acute osteomyelitis, left ankle and foot: Secondary | ICD-10-CM | POA: Diagnosis not present

## 2024-07-11 ENCOUNTER — Telehealth: Payer: Self-pay | Admitting: Podiatry

## 2024-07-11 ENCOUNTER — Ambulatory Visit: Admitting: Podiatry

## 2024-07-11 DIAGNOSIS — L97522 Non-pressure chronic ulcer of other part of left foot with fat layer exposed: Secondary | ICD-10-CM | POA: Diagnosis not present

## 2024-07-11 DIAGNOSIS — M7989 Other specified soft tissue disorders: Secondary | ICD-10-CM

## 2024-07-11 NOTE — Telephone Encounter (Signed)
 DOS- 07/16/2024  BIOPSY, BONE, TROCAR, OR NEEDLE; SUPERFICIAL LT- 20220 DEBRIDEMENT, SUBCUTANEOUS TISSUE; 20 CM OR LESS LT- 11042 APPLICATION OF SKIN SUBSTITUTE SKIN GRAFT LT- 15275  [employer] EFFECTIVE DATE- 06/21/2024  DEDUCTIBLE- N/A OOP- $3249 REMAINING- $3264 COINSURANCE- 0%  PER COHERE PORTAL, NO PRIOR AUTHS ARE REQUIRED FOR CPT CODES 79779, 11042, AND 15275. DOCUMENTATION ATTACHED TO SURGERY CONSENT PACKET.
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
## 2024-07-11 NOTE — Patient Instructions (Signed)

## 2024-07-12 DIAGNOSIS — E6609 Other obesity due to excess calories: Secondary | ICD-10-CM | POA: Diagnosis not present

## 2024-07-12 DIAGNOSIS — E782 Mixed hyperlipidemia: Secondary | ICD-10-CM | POA: Diagnosis not present

## 2024-07-12 DIAGNOSIS — I10 Essential (primary) hypertension: Secondary | ICD-10-CM | POA: Diagnosis not present

## 2024-07-12 DIAGNOSIS — E291 Testicular hypofunction: Secondary | ICD-10-CM | POA: Diagnosis not present

## 2024-07-12 DIAGNOSIS — E119 Type 2 diabetes mellitus without complications: Secondary | ICD-10-CM | POA: Diagnosis not present

## 2024-07-12 DIAGNOSIS — E1169 Type 2 diabetes mellitus with other specified complication: Secondary | ICD-10-CM | POA: Diagnosis not present

## 2024-07-12 DIAGNOSIS — Z794 Long term (current) use of insulin: Secondary | ICD-10-CM | POA: Diagnosis not present

## 2024-07-12 DIAGNOSIS — E559 Vitamin D deficiency, unspecified: Secondary | ICD-10-CM | POA: Diagnosis not present

## 2024-07-12 DIAGNOSIS — N185 Chronic kidney disease, stage 5: Secondary | ICD-10-CM | POA: Diagnosis not present

## 2024-07-14 ENCOUNTER — Telehealth: Payer: Self-pay | Admitting: Podiatry

## 2024-07-14 NOTE — Telephone Encounter (Signed)
 Called and spoke with patient's wife. She spoke said that patients PCP wants to see them tomorrow to review labs. Advised them to contact office after appointment and that as of right now surgery is scheduled for this Wednesday 07/16/2024.

## 2024-07-15 ENCOUNTER — Telehealth: Payer: Self-pay | Admitting: Podiatry

## 2024-07-15 DIAGNOSIS — E782 Mixed hyperlipidemia: Secondary | ICD-10-CM | POA: Diagnosis not present

## 2024-07-15 DIAGNOSIS — E119 Type 2 diabetes mellitus without complications: Secondary | ICD-10-CM | POA: Diagnosis not present

## 2024-07-15 DIAGNOSIS — N185 Chronic kidney disease, stage 5: Secondary | ICD-10-CM | POA: Diagnosis not present

## 2024-07-15 DIAGNOSIS — I129 Hypertensive chronic kidney disease with stage 1 through stage 4 chronic kidney disease, or unspecified chronic kidney disease: Secondary | ICD-10-CM | POA: Diagnosis not present

## 2024-07-15 DIAGNOSIS — E6609 Other obesity due to excess calories: Secondary | ICD-10-CM | POA: Diagnosis not present

## 2024-07-15 DIAGNOSIS — E1169 Type 2 diabetes mellitus with other specified complication: Secondary | ICD-10-CM | POA: Diagnosis not present

## 2024-07-15 DIAGNOSIS — E559 Vitamin D deficiency, unspecified: Secondary | ICD-10-CM | POA: Diagnosis not present

## 2024-07-15 DIAGNOSIS — E291 Testicular hypofunction: Secondary | ICD-10-CM | POA: Diagnosis not present

## 2024-07-15 DIAGNOSIS — Z794 Long term (current) use of insulin: Secondary | ICD-10-CM | POA: Diagnosis not present

## 2024-07-15 NOTE — Telephone Encounter (Signed)
 Called and spoke with patients wife- she states that PCP did not clear for patient to have surgery under anesthesia due to kidney issues. Gave the option of possibly doing procedure in office and this is how they would like to proceed. Patient would be unable to come for procedure tomorrow as they have an appointment with vascular. Patient is scheduled for office visit on 07/29/24/

## 2024-07-16 ENCOUNTER — Ambulatory Visit (HOSPITAL_BASED_OUTPATIENT_CLINIC_OR_DEPARTMENT_OTHER)
Admission: RE | Admit: 2024-07-16 | Discharge: 2024-07-16 | Disposition: A | Discharge status: Home / Self Care | Source: Ambulatory Visit | Attending: Podiatry | Admitting: Podiatry

## 2024-07-16 ENCOUNTER — Ambulatory Visit (HOSPITAL_COMMUNITY)
Admission: RE | Admit: 2024-07-16 | Discharge: 2024-07-16 | Disposition: A | Discharge status: Home / Self Care | Source: Ambulatory Visit | Attending: Podiatry | Admitting: Podiatry

## 2024-07-16 DIAGNOSIS — M7989 Other specified soft tissue disorders: Secondary | ICD-10-CM | POA: Diagnosis not present

## 2024-07-16 DIAGNOSIS — L97522 Non-pressure chronic ulcer of other part of left foot with fat layer exposed: Secondary | ICD-10-CM | POA: Insufficient documentation

## 2024-07-16 LAB — VAS US ABI WITH/WO TBI
Left ABI: 1.12
Right ABI: 1.17

## 2024-07-16 NOTE — Telephone Encounter (Signed)
 Called and office surgery is set for 07/23/2024. Patient aware to arrive @ 3:15

## 2024-07-16 NOTE — Telephone Encounter (Signed)
 DOS- 07/23/2024 *CHANGED TO OFFICE SURGERY*   BIOPSY, BONE, TROCAR, OR NEEDLE; SUPERFICIAL LT- 20220 DEBRIDEMENT, SUBCUTANEOUS TISSUE; 20 CM OR LESS LT- 11042 APPLICATION OF SKIN SUBSTITUTE SKIN GRAFT LT- 15275   [employer] EFFECTIVE DATE- 06/21/2024   DEDUCTIBLE- N/A OOP- $3249 REMAINING- $3264 COINSURANCE- 0%   PER COHERE PORTAL, NO PRIOR AUTHS ARE REQUIRED FOR CPT CODES 79779, 11042, AND 15275. DOCUMENTATION ATTACHED TO SURGERY CONSENT PACKET.

## 2024-07-21 ENCOUNTER — Ambulatory Visit: Payer: Self-pay | Admitting: Podiatry

## 2024-07-21 ENCOUNTER — Ambulatory Visit (HOSPITAL_COMMUNITY)
Admission: RE | Admit: 2024-07-21 | Discharge: 2024-07-21 | Disposition: A | Source: Ambulatory Visit | Attending: Podiatry

## 2024-07-21 ENCOUNTER — Other Ambulatory Visit: Payer: Self-pay | Admitting: Podiatry

## 2024-07-21 ENCOUNTER — Encounter: Admitting: Podiatry

## 2024-07-21 DIAGNOSIS — I739 Peripheral vascular disease, unspecified: Secondary | ICD-10-CM | POA: Insufficient documentation

## 2024-07-22 ENCOUNTER — Ambulatory Visit: Payer: Self-pay | Admitting: Podiatry

## 2024-07-22 ENCOUNTER — Other Ambulatory Visit: Payer: Self-pay | Admitting: Podiatry

## 2024-07-22 DIAGNOSIS — I739 Peripheral vascular disease, unspecified: Secondary | ICD-10-CM

## 2024-07-23 ENCOUNTER — Ambulatory Visit

## 2024-07-23 ENCOUNTER — Ambulatory Visit: Admitting: Podiatry

## 2024-07-23 ENCOUNTER — Other Ambulatory Visit (HOSPITAL_COMMUNITY)
Admission: RE | Admit: 2024-07-23 | Discharge: 2024-07-23 | Disposition: A | Source: Ambulatory Visit | Attending: Podiatry | Admitting: Podiatry

## 2024-07-23 VITALS — BP 171/72 | HR 69 | Resp 18

## 2024-07-23 DIAGNOSIS — I129 Hypertensive chronic kidney disease with stage 1 through stage 4 chronic kidney disease, or unspecified chronic kidney disease: Secondary | ICD-10-CM | POA: Diagnosis not present

## 2024-07-23 DIAGNOSIS — Z794 Long term (current) use of insulin: Secondary | ICD-10-CM | POA: Diagnosis not present

## 2024-07-23 DIAGNOSIS — L089 Local infection of the skin and subcutaneous tissue, unspecified: Secondary | ICD-10-CM | POA: Diagnosis not present

## 2024-07-23 DIAGNOSIS — E6609 Other obesity due to excess calories: Secondary | ICD-10-CM | POA: Diagnosis not present

## 2024-07-23 DIAGNOSIS — L97522 Non-pressure chronic ulcer of other part of left foot with fat layer exposed: Secondary | ICD-10-CM | POA: Insufficient documentation

## 2024-07-23 DIAGNOSIS — M86272 Subacute osteomyelitis, left ankle and foot: Secondary | ICD-10-CM

## 2024-07-23 DIAGNOSIS — E11628 Type 2 diabetes mellitus with other skin complications: Secondary | ICD-10-CM | POA: Diagnosis not present

## 2024-07-23 DIAGNOSIS — E119 Type 2 diabetes mellitus without complications: Secondary | ICD-10-CM | POA: Diagnosis not present

## 2024-07-23 DIAGNOSIS — F1120 Opioid dependence, uncomplicated: Secondary | ICD-10-CM | POA: Diagnosis not present

## 2024-07-23 MED ORDER — DOXYCYCLINE HYCLATE 100 MG PO TABS: 100.0000 mg | ORAL_TABLET | Freq: Two times a day (BID) | ORAL | 0 refills | Status: DC

## 2024-07-25 LAB — SURGICAL PATHOLOGY

## 2024-07-28 ENCOUNTER — Other Ambulatory Visit: Payer: Self-pay | Admitting: Podiatry

## 2024-07-28 ENCOUNTER — Ambulatory Visit: Admitting: Podiatry

## 2024-07-28 ENCOUNTER — Ambulatory Visit: Payer: Self-pay | Admitting: Podiatry

## 2024-07-28 DIAGNOSIS — M86272 Subacute osteomyelitis, left ankle and foot: Secondary | ICD-10-CM

## 2024-07-28 LAB — AEROBIC/ANAEROBIC CULTURE W GRAM STAIN (SURGICAL/DEEP WOUND)
Culture: NO GROWTH
Gram Stain: NONE SEEN

## 2024-07-28 NOTE — Progress Notes (Signed)
    Subjective:   Patient ID: Anthony Flowers, male   DOB: 63 y.o.   MRN: [id number]   HPI Chief Complaint  Patient presents with   Office Surgery    Left foot.     63 year old male presents the office today with the above concerns.  He has been off antibiotics for now.  Presents today for bone biopsy as he was not cleared to have surgery by his PCP.   Objective:  Physical Exam  General: AAO x3, NAD  Dermatological: Overall the wounds are about the same in size.  There is granulation tissue present.  There is no drainage or pus identified at this time.  There is still edema to the foot and leg and there is no significant erythema or warmth.  There is no areas of fluctuation or crepitation.  There is no malodor.  Vascular: Dorsalis Pedis artery and Posterior Tibial artery pedal pulses are palpable bilateral with immedate capillary fill time. There is no pain with calf compression, warmth, erythema.   Neruologic: Grossly intact via light touch bilateral.   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  We can discussed MRI results as well as both trying to salvage the limb versus amputation.  Unfortunately is not cleared for surgery for bone biopsies around proceed with this in the office to help better target antibiotics.  Discussed that he is still high risk of BKA or proximal limb loss.  He presents today with his wife who is also well aware of this.  We discussed alternatives, risks, complications.  No promises or guarantees given.  I cleaned the skin with alcohol and I infiltrated a mixture of lidocaine , Marcaine  plain in a regional block fashion around  the area of the bone biopsy.  The left foot was then scrubbed, prepped, draped in a sterile fashion.  Timeout performed.  At this time a small stab incision was made along the lateral aspect of the foot along the area of the cuboid.  A Jamshidi needle was then introduced and the bone was removed.  This was sent to pathology as well as microbiology.  No purulence noted.  Bone did appear to be soft.  They are getting incision and the incision was then closed with nylon.  He tolerated the procedure well.  Postprocedure x-rays were obtained and reviewed.  Multiple views obtained.  No evidence of acute fracture. - Will restart antibiotics.  Prescribed doxycycline  await culture results.  Likely referred to infectious disease pending results. -Monitor for any clinical signs or symptoms of infection and directed to call the office immediately should any occur or go to the ER.  Return in about 1 week (around 07/30/2024) for follow-up from bone biopsy.  Donnice JONELLE Fees DPM

## 2024-07-29 ENCOUNTER — Ambulatory Visit: Admitting: Podiatry

## 2024-07-31 ENCOUNTER — Ambulatory Visit: Admitting: Podiatry

## 2024-08-04 ENCOUNTER — Telehealth: Payer: Self-pay

## 2024-08-04 ENCOUNTER — Other Ambulatory Visit: Payer: Self-pay | Admitting: Podiatry

## 2024-08-04 ENCOUNTER — Encounter

## 2024-08-04 MED ORDER — DOXYCYCLINE HYCLATE 100 MG PO TABS: 100.0000 mg | ORAL_TABLET | Freq: Two times a day (BID) | ORAL | 0 refills | Status: AC

## 2024-08-04 MED ORDER — CLINDAMYCIN HCL 300 MG PO CAPS: 300.0000 mg | ORAL_CAPSULE | Freq: Three times a day (TID) | ORAL | 0 refills | Status: DC

## 2024-08-04 NOTE — Telephone Encounter (Signed)
 Patient's wife called concerned about a new spot that has appeared close to current wound. Pt is concerned about an ongoing infection in foot. Pt states taking doxycyline. Pt would like advise on further course of treatment.

## 2024-08-04 NOTE — Telephone Encounter (Signed)
 Called and spoke with patient, he agreed to submit a photo via MyChart and will schedule a follow-up appointment.

## 2024-08-04 NOTE — Telephone Encounter (Signed)
 Patient's wife called requesting sooner appointment due to foot wound concerns. Next available appointment is 12/29 and patient is currently scheduled for 12/23. Recommended she reach out to referring provider, Dr. Gershon, in the meantime. She states she will take the patient back to [place]  for his care. Advised evaluation in the emergency department if she felt like Sasha needed immediate attention. She states she will take him back to [place]  and call us  if they will not be able to make it to his appointment on 12/23.  Jeovani Weisenburger, BSN, RN

## 2024-08-04 NOTE — Telephone Encounter (Signed)
 Spoke with Anthony Flowers.  She states that yesterday was in earlier and she cleaned the skin and is not as odorous today.  He has been on doxycycline .  Previously she reports she was on doxycycline  and clindamycin  and did well with the combination and I refilled this.  Continue daily dressing changes, offloading.  There is any worsening signs or symptoms of infection needs to report directly to the emergency department.  Can we schedule him a follow up this week? Thanks!

## 2024-08-08 ENCOUNTER — Ambulatory Visit

## 2024-08-08 ENCOUNTER — Ambulatory Visit: Admitting: Podiatry

## 2024-08-08 DIAGNOSIS — M86272 Subacute osteomyelitis, left ankle and foot: Secondary | ICD-10-CM

## 2024-08-08 DIAGNOSIS — M7989 Other specified soft tissue disorders: Secondary | ICD-10-CM

## 2024-08-08 DIAGNOSIS — I739 Peripheral vascular disease, unspecified: Secondary | ICD-10-CM

## 2024-08-08 DIAGNOSIS — L03116 Cellulitis of left lower limb: Secondary | ICD-10-CM

## 2024-08-08 DIAGNOSIS — L97522 Non-pressure chronic ulcer of other part of left foot with fat layer exposed: Secondary | ICD-10-CM

## 2024-08-09 ENCOUNTER — Encounter: Payer: Self-pay | Admitting: Podiatry
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
## 2024-08-11 ENCOUNTER — Other Ambulatory Visit: Payer: Self-pay

## 2024-08-11 ENCOUNTER — Encounter: Payer: Self-pay | Admitting: Surgery

## 2024-08-11 ENCOUNTER — Ambulatory Visit: Attending: Surgery | Admitting: Surgery

## 2024-08-11 VITALS — BP 159/73 | HR 78 | Temp 98.0°F

## 2024-08-11 DIAGNOSIS — I70244 Atherosclerosis of native arteries of left leg with ulceration of heel and midfoot: Secondary | ICD-10-CM
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
## 2024-08-12 ENCOUNTER — Ambulatory Visit: Payer: Self-pay | Admitting: Lab

## 2024-08-12 ENCOUNTER — Telehealth: Payer: Self-pay

## 2024-08-12 ENCOUNTER — Encounter: Payer: Self-pay | Admitting: Infectious Diseases

## 2024-08-12 ENCOUNTER — Ambulatory Visit (INDEPENDENT_AMBULATORY_CARE_PROVIDER_SITE_OTHER): Admitting: Infectious Diseases

## 2024-08-12 ENCOUNTER — Encounter: Payer: Self-pay | Admitting: Lab

## 2024-08-12 ENCOUNTER — Other Ambulatory Visit: Payer: Self-pay

## 2024-08-12 VITALS — BP 181/81 | HR 63 | Temp 98.3°F | Ht 72.0 in

## 2024-08-12 DIAGNOSIS — L97529 Non-pressure chronic ulcer of other part of left foot with unspecified severity: Secondary | ICD-10-CM

## 2024-08-12 DIAGNOSIS — Z880 Allergy status to penicillin: Secondary | ICD-10-CM | POA: Diagnosis not present

## 2024-08-12 DIAGNOSIS — Z794 Long term (current) use of insulin: Secondary | ICD-10-CM | POA: Diagnosis not present

## 2024-08-12 DIAGNOSIS — N189 Chronic kidney disease, unspecified: Secondary | ICD-10-CM | POA: Diagnosis not present

## 2024-08-12 DIAGNOSIS — E1142 Type 2 diabetes mellitus with diabetic polyneuropathy: Secondary | ICD-10-CM | POA: Diagnosis not present

## 2024-08-12 DIAGNOSIS — R509 Fever, unspecified: Secondary | ICD-10-CM

## 2024-08-12 MED ORDER — DOXYCYCLINE HYCLATE 100 MG PO TABS: 100.0000 mg | ORAL_TABLET | Freq: Two times a day (BID) | ORAL | 0 refills | Status: AC

## 2024-08-12 MED ORDER — CIPROFLOXACIN HCL 500 MG PO TABS: 500.0000 mg | ORAL_TABLET | Freq: Every day | ORAL | 0 refills | Status: AC

## 2024-08-12 MED ORDER — CIPROFLOXACIN HCL 500 MG PO TABS: 500.0000 mg | ORAL_TABLET | Freq: Two times a day (BID) | ORAL | 0 refills | Status: DC
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
## 2024-08-12 NOTE — Telephone Encounter (Signed)
 Pt's wife stated that he had a MRI foot done on 12/3 she pulled it up on his mychart but was not able to pull up report. Called [facility] and left a message on nurse line. Received a call back from triage nurse who stated that the only Mri the patient had done was of the ankle on 11/18, no Mri of the foot.

## 2024-08-13 DIAGNOSIS — N189 Chronic kidney disease, unspecified: Secondary | ICD-10-CM | POA: Insufficient documentation

## 2024-08-14 ENCOUNTER — Telehealth: Payer: Self-pay | Admitting: Internal Medicine

## 2024-08-14 ENCOUNTER — Ambulatory Visit: Payer: Self-pay | Admitting: Infectious Diseases

## 2024-08-14 LAB — CULTURE, BLOOD (SINGLE)

## 2024-08-14 NOTE — Telephone Encounter (Signed)
 Paged by [facility] for blood Cx + gpc. Called pt and spoke to pt and then wife nicole,  counseled to go to ED[place] ED for blood cx, abx and imaging.

## 2024-08-15 ENCOUNTER — Telehealth: Payer: Self-pay | Admitting: Internal Medicine

## 2024-08-15 NOTE — Telephone Encounter (Signed)
 Received another page [facility].  Blood cultures growing Staph aureus.  I called patient to inform her results, he is only admitted at [facility]. Called [facility] and relayed results, recc start vanc.

## 2024-08-18 ENCOUNTER — Encounter: Admitting: Podiatry

## 2024-08-18 LAB — CBC
HCT: 30.9 % — ABNORMAL LOW (ref 39.4–51.1)
Hemoglobin: 9.1 g/dL — ABNORMAL LOW (ref 13.2–17.1)
MCH: 21.7 pg — ABNORMAL LOW (ref 27.0–33.0)
MCHC: 29.4 g/dL — ABNORMAL LOW (ref 31.6–35.4)
MCV: 73.7 fL — ABNORMAL LOW (ref 81.4–101.7)
MPV: 10.3 fL (ref 7.5–12.5)
Platelets: 408 Thousand/uL — ABNORMAL HIGH (ref 140–400)
RBC: 4.19 Million/uL — ABNORMAL LOW (ref 4.20–5.80)
RDW: 20.8 % — ABNORMAL HIGH (ref 11.0–15.0)
WBC: 5.5 Thousand/uL (ref 3.8–10.8)

## 2024-08-18 LAB — BASIC METABOLIC PANEL WITH GFR
BUN/Creatinine Ratio: 7 (calc) (ref 6–22)
BUN: 41 mg/dL — ABNORMAL HIGH (ref 7–25)
CO2: 19 mmol/L — ABNORMAL LOW (ref 20–32)
Calcium: 8.6 mg/dL (ref 8.6–10.3)
Chloride: 104 mmol/L (ref 98–110)
Creat: 6.28 mg/dL — ABNORMAL HIGH (ref 0.70–1.35)
Glucose, Bld: 279 mg/dL — ABNORMAL HIGH (ref 65–99)
Potassium: 5 mmol/L (ref 3.5–5.3)
Sodium: 133 mmol/L — ABNORMAL LOW (ref 135–146)
eGFR: 9 mL/min/1.73m2 — ABNORMAL LOW

## 2024-08-18 LAB — CULTURE, BLOOD (SINGLE)
MICRO NUMBER:: 17395396
SPECIMEN QUALITY:: ADEQUATE

## 2024-08-18 LAB — SEDIMENTATION RATE: Sed Rate: 58 mm/h — ABNORMAL HIGH (ref 0–20)

## 2024-08-18 LAB — C-REACTIVE PROTEIN: CRP: 3.9 mg/L

## 2024-08-18 NOTE — Progress Notes (Signed)
 Pharmacy to Manage Therapeutic Drug Monitoring Adult  Objective Indication for therapy: Blood stream            Vancomycin  start date: 08/14/24  Weight: 101 kg (222 lb 10.6 oz), Ideal body weight: 79.9 kg (176 lb 1.7 oz) Adjusted ideal body weight: 88.3 kg (194 lb 11.7 oz),   Height: Height: 1.854 m (6' 0.99) (08/14/2024  1:10 PM) BMI: Body mass index is 29.38 kg/m.  Serum creatinine: 6.51 mg/dL (H) 87/71/74 9542 Estimated creatinine clearance: 14.3 mL/min (A) Renal replacement therapy: No   Vancomycin  Administrations Vancomycin  Administrations (last 168 hours)     Date/Time Action Medication Dose Rate   08/16/24 2215 Given   vancomycin  750 mg in sodium chloride  0.9% 250 mL IVPB 750 mg    08/15/24 1656 New Bag   vancomycin  2 g in sodium chloride  0.9% 500 mL IVPB 2,000 mg 250 mL/hr       Current Anti-infective Orders Antimicrobials (From admission, onward)    Start     Stop Route Frequency Ordered   08/18/24 0900  vancomycin  750 mg in sodium chloride  0.9% 250 mL IVPB        -- IV Once 08/18/24 0336       Pertinent culture data: 12/25 Blood - MRSA x2; 12/27 Blood - IP  Vancomycin  Levels Vancomycin  Level, Random (ug/mL)  Date/Time Value  08/17/2024 2308 18.8  08/16/2024 1808 14.1    Assessment/Plan Goal trough 10-15 mcg/mLRedose when less than 15  The level(s) indicate vancomycin  is supratherapeuticvR @1808 =14.1.  Based on this, will Other -Since vR level was drawn past 25 hours from the initial dose, will round down the next dose to 750mg  750mg  once in AM + level IVPB once.  Additional serum concentrations may be necessary depending on pathogen identified, risk factors for adverse events, and/or duration of therapy.  Pharmacy will continue to follow and adjust as needed.   Thank you, Donnice JONELLE Spore, [facility] 08/18/2024 3:36 AM

## 2024-08-26 ENCOUNTER — Encounter (HOSPITAL_COMMUNITY): Admission: RE | Payer: Self-pay | Source: Home / Self Care

## 2024-08-26 ENCOUNTER — Ambulatory Visit (HOSPITAL_COMMUNITY): Admission: RE | Admit: 2024-08-26 | Source: Home / Self Care | Admitting: Surgery

## 2024-08-26 SURGERY — ABDOMINAL AORTOGRAM W/LOWER EXTREMITY
Anesthesia: LOCAL

## 2024-08-26 NOTE — Progress Notes (Signed)
 Pt not coming in for aorto today due to recently being discharged from hospital and needing abx through a PICC throughout the day. Stated they will reschedule.

## 2024-08-28 ENCOUNTER — Ambulatory Visit: Admitting: Student-PharmD

## 2024-08-28 ENCOUNTER — Telehealth: Payer: Self-pay

## 2024-08-28 NOTE — Telephone Encounter (Signed)
 Patient is still recovering from recent hospital stay.  Per spouse, they will call when ready to schedule angio with [facility].

## 2024-08-29 ENCOUNTER — Ambulatory Visit

## 2024-09-03 ENCOUNTER — Telehealth: Payer: Self-pay

## 2024-09-03 ENCOUNTER — Ambulatory Visit: Payer: Self-pay | Admitting: Infectious Diseases

## 2024-09-03 NOTE — Telephone Encounter (Signed)
 Spoke with patient wife regarding appointment today- was informed that he is planning on continuing care with [facility]. Is scheduled for follow up on 09/11/24. Appointment canceled. Will follow up with Dr. Manandhar as needed. Lorenda CHRISTELLA Code, RMA
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
## 2024-09-04 ENCOUNTER — Ambulatory Visit: Payer: Self-pay | Attending: Student-PharmD | Admitting: Student-PharmD

## 2024-09-04 ENCOUNTER — Ambulatory Visit: Admitting: Allergy & Immunology
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
## 2024-09-10 ENCOUNTER — Ambulatory Visit: Admitting: Urology

## 2024-09-10 DIAGNOSIS — R972 Elevated prostate specific antigen [PSA]: Secondary | ICD-10-CM
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
## 2024-10-14 ENCOUNTER — Ambulatory Visit: Admitting: Allergy & Immunology
